# Patient Record
Sex: Female | Born: 1960
Health system: Southern US, Community
[De-identification: ages and names within clinical notes are randomized; demographics above are authoritative.]

## PROBLEM LIST (undated history)

## (undated) DIAGNOSIS — I10 Essential (primary) hypertension: Secondary | ICD-10-CM

## (undated) DIAGNOSIS — E785 Hyperlipidemia, unspecified: Secondary | ICD-10-CM

## (undated) DIAGNOSIS — Z720 Tobacco use: Secondary | ICD-10-CM

## (undated) DIAGNOSIS — K219 Gastro-esophageal reflux disease without esophagitis: Secondary | ICD-10-CM

## (undated) HISTORY — DX: Hyperlipidemia, unspecified: E78.5

## (undated) HISTORY — DX: Tobacco use: Z72.0

## (undated) HISTORY — DX: Gastro-esophageal reflux disease without esophagitis: K21.9

## (undated) HISTORY — DX: Essential (primary) hypertension: I10

## (undated) HISTORY — PX: CATARACT EXTRACTION: SUR2

## (undated) HISTORY — PX: RADIATION SOURCE  PLACEMENT W/ CATHETER / PROBE W/ STEREOTACTIC LOCALIZATION: SUR1037

## (undated) HISTORY — PX: APPENDECTOMY: SHX54

## (undated) HISTORY — DX: Hemochromatosis, unspecified: E83.119

---

## 2015-09-09 DIAGNOSIS — M62838 Other muscle spasm: Secondary | ICD-10-CM | POA: Diagnosis not present

## 2015-09-09 DIAGNOSIS — M5442 Lumbago with sciatica, left side: Secondary | ICD-10-CM | POA: Diagnosis not present

## 2015-10-21 DIAGNOSIS — K219 Gastro-esophageal reflux disease without esophagitis: Secondary | ICD-10-CM | POA: Diagnosis not present

## 2015-10-21 DIAGNOSIS — Z720 Tobacco use: Secondary | ICD-10-CM | POA: Diagnosis not present

## 2015-10-21 DIAGNOSIS — E785 Hyperlipidemia, unspecified: Secondary | ICD-10-CM | POA: Diagnosis not present

## 2015-10-21 DIAGNOSIS — Z0000 Encounter for general adult medical examination without abnormal findings: Secondary | ICD-10-CM | POA: Diagnosis not present

## 2015-10-21 DIAGNOSIS — Z1389 Encounter for screening for other disorder: Secondary | ICD-10-CM | POA: Diagnosis not present

## 2015-11-09 DIAGNOSIS — R928 Other abnormal and inconclusive findings on diagnostic imaging of breast: Secondary | ICD-10-CM | POA: Diagnosis not present

## 2015-11-09 DIAGNOSIS — N6489 Other specified disorders of breast: Secondary | ICD-10-CM | POA: Diagnosis not present

## 2015-11-09 DIAGNOSIS — R922 Inconclusive mammogram: Secondary | ICD-10-CM | POA: Diagnosis not present

## 2016-01-04 DIAGNOSIS — R51 Headache: Secondary | ICD-10-CM | POA: Diagnosis not present

## 2016-01-04 DIAGNOSIS — R05 Cough: Secondary | ICD-10-CM | POA: Diagnosis not present

## 2016-01-04 DIAGNOSIS — J329 Chronic sinusitis, unspecified: Secondary | ICD-10-CM | POA: Diagnosis not present

## 2016-01-23 DIAGNOSIS — Z23 Encounter for immunization: Secondary | ICD-10-CM | POA: Diagnosis not present

## 2016-01-27 DIAGNOSIS — I10 Essential (primary) hypertension: Secondary | ICD-10-CM | POA: Diagnosis not present

## 2016-01-27 DIAGNOSIS — Z720 Tobacco use: Secondary | ICD-10-CM | POA: Diagnosis not present

## 2016-01-27 DIAGNOSIS — E785 Hyperlipidemia, unspecified: Secondary | ICD-10-CM | POA: Diagnosis not present

## 2016-03-28 DIAGNOSIS — J329 Chronic sinusitis, unspecified: Secondary | ICD-10-CM | POA: Diagnosis not present

## 2016-03-28 DIAGNOSIS — I10 Essential (primary) hypertension: Secondary | ICD-10-CM | POA: Diagnosis not present

## 2016-03-28 DIAGNOSIS — E785 Hyperlipidemia, unspecified: Secondary | ICD-10-CM | POA: Diagnosis not present

## 2016-10-05 DIAGNOSIS — M9901 Segmental and somatic dysfunction of cervical region: Secondary | ICD-10-CM | POA: Diagnosis not present

## 2016-10-05 DIAGNOSIS — M9902 Segmental and somatic dysfunction of thoracic region: Secondary | ICD-10-CM | POA: Diagnosis not present

## 2016-10-05 DIAGNOSIS — M542 Cervicalgia: Secondary | ICD-10-CM | POA: Diagnosis not present

## 2016-10-05 DIAGNOSIS — M50322 Other cervical disc degeneration at C5-C6 level: Secondary | ICD-10-CM | POA: Diagnosis not present

## 2016-10-17 DIAGNOSIS — E876 Hypokalemia: Secondary | ICD-10-CM | POA: Diagnosis not present

## 2016-10-17 DIAGNOSIS — I214 Non-ST elevation (NSTEMI) myocardial infarction: Secondary | ICD-10-CM | POA: Diagnosis not present

## 2016-10-17 DIAGNOSIS — I119 Hypertensive heart disease without heart failure: Secondary | ICD-10-CM | POA: Diagnosis not present

## 2016-10-17 DIAGNOSIS — F17211 Nicotine dependence, cigarettes, in remission: Secondary | ICD-10-CM | POA: Diagnosis not present

## 2016-10-17 DIAGNOSIS — R079 Chest pain, unspecified: Secondary | ICD-10-CM | POA: Diagnosis not present

## 2016-10-18 DIAGNOSIS — I119 Hypertensive heart disease without heart failure: Secondary | ICD-10-CM | POA: Diagnosis not present

## 2016-10-18 DIAGNOSIS — F418 Other specified anxiety disorders: Secondary | ICD-10-CM | POA: Diagnosis not present

## 2016-10-18 DIAGNOSIS — R079 Chest pain, unspecified: Secondary | ICD-10-CM | POA: Diagnosis not present

## 2016-10-18 DIAGNOSIS — Z79899 Other long term (current) drug therapy: Secondary | ICD-10-CM | POA: Diagnosis not present

## 2016-10-18 DIAGNOSIS — I249 Acute ischemic heart disease, unspecified: Secondary | ICD-10-CM | POA: Diagnosis not present

## 2016-10-18 DIAGNOSIS — M1990 Unspecified osteoarthritis, unspecified site: Secondary | ICD-10-CM | POA: Diagnosis not present

## 2016-10-18 DIAGNOSIS — I10 Essential (primary) hypertension: Secondary | ICD-10-CM | POA: Diagnosis not present

## 2016-10-18 DIAGNOSIS — E876 Hypokalemia: Secondary | ICD-10-CM | POA: Diagnosis not present

## 2016-10-18 DIAGNOSIS — I252 Old myocardial infarction: Secondary | ICD-10-CM | POA: Diagnosis not present

## 2016-10-18 DIAGNOSIS — Z87891 Personal history of nicotine dependence: Secondary | ICD-10-CM | POA: Diagnosis not present

## 2016-10-18 DIAGNOSIS — F17211 Nicotine dependence, cigarettes, in remission: Secondary | ICD-10-CM | POA: Diagnosis not present

## 2016-10-18 DIAGNOSIS — I214 Non-ST elevation (NSTEMI) myocardial infarction: Secondary | ICD-10-CM | POA: Diagnosis not present

## 2016-10-18 DIAGNOSIS — Z9889 Other specified postprocedural states: Secondary | ICD-10-CM | POA: Diagnosis not present

## 2016-10-18 DIAGNOSIS — E785 Hyperlipidemia, unspecified: Secondary | ICD-10-CM | POA: Diagnosis not present

## 2016-11-07 DIAGNOSIS — I5181 Takotsubo syndrome: Secondary | ICD-10-CM | POA: Diagnosis not present

## 2016-11-07 DIAGNOSIS — F17200 Nicotine dependence, unspecified, uncomplicated: Secondary | ICD-10-CM | POA: Insufficient documentation

## 2016-11-07 DIAGNOSIS — IMO0001 Reserved for inherently not codable concepts without codable children: Secondary | ICD-10-CM | POA: Insufficient documentation

## 2016-11-07 DIAGNOSIS — I10 Essential (primary) hypertension: Secondary | ICD-10-CM | POA: Diagnosis not present

## 2016-11-07 DIAGNOSIS — R29898 Other symptoms and signs involving the musculoskeletal system: Secondary | ICD-10-CM | POA: Insufficient documentation

## 2016-11-07 DIAGNOSIS — Z0389 Encounter for observation for other suspected diseases and conditions ruled out: Secondary | ICD-10-CM

## 2017-01-11 DIAGNOSIS — I10 Essential (primary) hypertension: Secondary | ICD-10-CM | POA: Diagnosis not present

## 2017-01-11 DIAGNOSIS — Z0000 Encounter for general adult medical examination without abnormal findings: Secondary | ICD-10-CM | POA: Diagnosis not present

## 2017-01-11 DIAGNOSIS — I110 Hypertensive heart disease with heart failure: Secondary | ICD-10-CM | POA: Diagnosis not present

## 2017-01-11 DIAGNOSIS — Z1389 Encounter for screening for other disorder: Secondary | ICD-10-CM | POA: Diagnosis not present

## 2017-01-11 DIAGNOSIS — Z6826 Body mass index (BMI) 26.0-26.9, adult: Secondary | ICD-10-CM | POA: Diagnosis not present

## 2017-01-13 DIAGNOSIS — J189 Pneumonia, unspecified organism: Secondary | ICD-10-CM | POA: Diagnosis not present

## 2017-01-26 DIAGNOSIS — R12 Heartburn: Secondary | ICD-10-CM | POA: Diagnosis not present

## 2017-01-26 DIAGNOSIS — Z1211 Encounter for screening for malignant neoplasm of colon: Secondary | ICD-10-CM | POA: Diagnosis not present

## 2017-02-27 DIAGNOSIS — R12 Heartburn: Secondary | ICD-10-CM | POA: Diagnosis not present

## 2017-02-27 DIAGNOSIS — Z1211 Encounter for screening for malignant neoplasm of colon: Secondary | ICD-10-CM | POA: Diagnosis not present

## 2017-06-28 DIAGNOSIS — J0100 Acute maxillary sinusitis, unspecified: Secondary | ICD-10-CM | POA: Diagnosis not present

## 2017-11-07 DIAGNOSIS — R1012 Left upper quadrant pain: Secondary | ICD-10-CM | POA: Diagnosis not present

## 2017-11-07 DIAGNOSIS — Z720 Tobacco use: Secondary | ICD-10-CM | POA: Diagnosis not present

## 2017-11-07 DIAGNOSIS — J329 Chronic sinusitis, unspecified: Secondary | ICD-10-CM | POA: Diagnosis not present

## 2017-11-15 DIAGNOSIS — Z1231 Encounter for screening mammogram for malignant neoplasm of breast: Secondary | ICD-10-CM | POA: Diagnosis not present

## 2018-01-10 DIAGNOSIS — Z23 Encounter for immunization: Secondary | ICD-10-CM | POA: Diagnosis not present

## 2018-01-10 DIAGNOSIS — Z0000 Encounter for general adult medical examination without abnormal findings: Secondary | ICD-10-CM | POA: Diagnosis not present

## 2018-01-10 DIAGNOSIS — Z1331 Encounter for screening for depression: Secondary | ICD-10-CM | POA: Diagnosis not present

## 2018-01-10 DIAGNOSIS — E782 Mixed hyperlipidemia: Secondary | ICD-10-CM | POA: Diagnosis not present

## 2018-01-10 DIAGNOSIS — I10 Essential (primary) hypertension: Secondary | ICD-10-CM | POA: Diagnosis not present

## 2018-01-17 DIAGNOSIS — R1084 Generalized abdominal pain: Secondary | ICD-10-CM | POA: Diagnosis not present

## 2018-01-17 DIAGNOSIS — N3001 Acute cystitis with hematuria: Secondary | ICD-10-CM | POA: Diagnosis not present

## 2018-01-17 DIAGNOSIS — F17210 Nicotine dependence, cigarettes, uncomplicated: Secondary | ICD-10-CM | POA: Diagnosis not present

## 2018-01-17 DIAGNOSIS — R300 Dysuria: Secondary | ICD-10-CM | POA: Diagnosis not present

## 2018-06-19 DIAGNOSIS — R079 Chest pain, unspecified: Secondary | ICD-10-CM | POA: Diagnosis not present

## 2018-06-19 DIAGNOSIS — R350 Frequency of micturition: Secondary | ICD-10-CM | POA: Diagnosis not present

## 2018-06-19 DIAGNOSIS — J329 Chronic sinusitis, unspecified: Secondary | ICD-10-CM | POA: Diagnosis not present

## 2018-06-25 DIAGNOSIS — J988 Other specified respiratory disorders: Secondary | ICD-10-CM | POA: Diagnosis not present

## 2018-06-25 DIAGNOSIS — R51 Headache: Secondary | ICD-10-CM | POA: Diagnosis not present

## 2018-06-25 DIAGNOSIS — R05 Cough: Secondary | ICD-10-CM | POA: Diagnosis not present

## 2018-07-04 ENCOUNTER — Other Ambulatory Visit: Payer: Self-pay

## 2018-07-05 ENCOUNTER — Encounter: Payer: Self-pay | Admitting: Cardiology

## 2018-07-05 ENCOUNTER — Ambulatory Visit (INDEPENDENT_AMBULATORY_CARE_PROVIDER_SITE_OTHER): Payer: BLUE CROSS/BLUE SHIELD | Admitting: Cardiology

## 2018-07-05 ENCOUNTER — Other Ambulatory Visit: Payer: Self-pay

## 2018-07-05 VITALS — BP 130/62 | HR 83 | Wt 173.0 lb

## 2018-07-05 DIAGNOSIS — R079 Chest pain, unspecified: Secondary | ICD-10-CM

## 2018-07-05 DIAGNOSIS — E785 Hyperlipidemia, unspecified: Secondary | ICD-10-CM | POA: Diagnosis not present

## 2018-07-05 DIAGNOSIS — R0789 Other chest pain: Secondary | ICD-10-CM | POA: Diagnosis not present

## 2018-07-05 DIAGNOSIS — I10 Essential (primary) hypertension: Secondary | ICD-10-CM | POA: Diagnosis not present

## 2018-07-05 DIAGNOSIS — I25119 Atherosclerotic heart disease of native coronary artery with unspecified angina pectoris: Secondary | ICD-10-CM | POA: Insufficient documentation

## 2018-07-05 MED ORDER — NITROGLYCERIN 0.4 MG SL SUBL: 0.4000 mg | SUBLINGUAL_TABLET | SUBLINGUAL | 2 refills | Status: DC | PRN

## 2018-07-05 NOTE — Progress Notes (Signed)
Cardiology Office Note:    Date:  07/05/2018   ID:  Lori Freeman, DOB 1960/09/02, MRN [id number]  PCP:  Garwin Brothers, MD  Cardiologist:  Jenean Lindau, MD   Referring MD: Garwin Brothers, MD    ASSESSMENT:    1. Chest discomfort   2. Essential hypertension   3. Dyslipidemia   4. Chest pain, unspecified type    PLAN:    In order of problems listed above:  1. Primary prevention stressed with the patient.  Importance of compliance with diet and medication stressed and she vocalized understanding.  Her blood pressure is stable.  Diet was discussed for dyslipidemia and diabetes mellitus. 2. I spent 5 minutes with the patient discussing solely about smoking. Smoking cessation was counseled. I suggested to the patient also different medications and pharmacological interventions. Patient is keen to try stopping on its own at this time. He will get back to me if he needs any further assistance in this matter. 3. In view of significant symptoms she will undergo exercise stress Cardiolite.  Further recommendations will be made based on the findings of this test. 4. Patient will be seen in follow-up appointment in 3 months or earlier if the patient has any concerns    Medication Adjustments/Labs and Tests Ordered: Current medicines are reviewed at length with the patient today.  Concerns regarding medicines are outlined above.  Orders Placed This Encounter  Procedures  . MYOCARDIAL PERFUSION IMAGING  . EKG 12-Lead  . ECHOCARDIOGRAM COMPLETE   Meds ordered this encounter  Medications  . nitroGLYCERIN (NITROSTAT) 0.4 MG SL tablet    Sig: Place 1 tablet (0.4 mg total) under the tongue every 5 (five) minutes as needed for chest pain.    Dispense:  25 tablet    Refill:  2     History of Present Illness:    Lori Freeman is a 58 y.o. female who is being seen today for the evaluation of chest discomfort on exertion at the request of Garwin Brothers, MD.  Patient is a pleasant 58 year old female.   She has past medical history of essential hypertension, dyslipidemia and active heavy smoker.  She mentions to me that upon exertion she has substernal chest tightness.  No orthopnea or PND.  She is very concerned about the symptoms and wants to be evaluated.  They are affecting her quality of life.  At the time of my evaluation, the patient is alert awake oriented and in no distress.  I reviewed her 2018 coronary angiography and this was unremarkable.  Past Medical History:  Diagnosis Date  . Hemochromatosis   . Hyperlipidemia   . Hypertension      Current Medications: No outpatient medications have been marked as taking for the 07/05/18 encounter (Office Visit) with Revankar, Reita Cliche, MD.     Allergies:   Bupropion   Social History   Socioeconomic History  . Marital status: Widowed    Spouse name: Not on file  . Number of children: Not on file  . Years of education: Not on file  . Highest education level: Not on file  Occupational History  . Not on file  Social Needs  . Financial resource strain: Not on file  . Food insecurity:    Worry: Not on file    Inability: Not on file  . Transportation needs:    Medical: Not on file    Non-medical: Not on file  Tobacco Use  . Smoking status: Former Research scientist (life sciences)  . Smokeless tobacco:  Never Used  Substance and Sexual Activity  . Alcohol use: Not on file  . Drug use: Not on file  . Sexual activity: Not on file  Lifestyle  . Physical activity:    Days per week: Not on file    Minutes per session: Not on file  . Stress: Not on file  Relationships  . Social connections:    Talks on phone: Not on file    Gets together: Not on file    Attends religious service: Not on file    Active member of club or organization: Not on file    Attends meetings of clubs or organizations: Not on file    Relationship status: Not on file  Other Topics Concern  . Not on file  Social History Narrative  . Not on file     Family History: The patient's  family history includes Heart disease in her father; Hypertension in her father.  ROS:   Please see the history of present illness.    All other systems reviewed and are negative.  EKGs/Labs/Other Studies Reviewed:    The following studies were reviewed today: EKG reveals sinus rhythm and nonspecific ST-T changes.   Recent Labs: No results found for requested labs within last 8760 hours.  Recent Lipid Panel No results found for: CHOL, TRIG, HDL, CHOLHDL, VLDL, LDLCALC, LDLDIRECT  Physical Exam:    VS:  BP 130/62 (BP Location: Left Arm, Patient Position: Sitting, Cuff Size: Normal)   Pulse 83   Wt 173 lb (78.5 kg)   SpO2 98%     Wt Readings from Last 3 Encounters:  07/05/18 173 lb (78.5 kg)     GEN: Patient is in no acute distress HEENT: Normal NECK: No JVD; No carotid bruits LYMPHATICS: No lymphadenopathy CARDIAC: S1 S2 regular, 2/6 systolic murmur at the apex. RESPIRATORY:  Clear to auscultation without rales, wheezing or rhonchi  ABDOMEN: Soft, non-tender, non-distended MUSCULOSKELETAL:  No edema; No deformity  SKIN: Warm and dry NEUROLOGIC:  Alert and oriented x 3 PSYCHIATRIC:  Normal affect    Signed, Jenean Lindau, MD  07/05/2018 10:30 AM    [place]

## 2018-07-05 NOTE — Patient Instructions (Signed)
Medication Instructions:  START taking nitroglycerin for chest pain as needed When having chest pain, stop what you are doing and sit down. Take 1 nitro, wait 5 minutes. Still having chest pain, take 1 nitro, wait 5 minutes. Still having chest pain, take 1 nitro, dial 911. Total of 3 nitro in 15 minutes.  If you need a refill on your cardiac medications before your next appointment, please call your pharmacy.   Lab work: NONE If you have labs (blood work) drawn today and your tests are completely normal, you will receive your results only by:  [place] (if you have MyChart) OR  A paper copy in the mail If you have any lab test that is abnormal or we need to change your treatment, we will call you to review the results.  Testing/Procedures: An EKG was performed today.  Your physician has requested that you have an echocardiogram. Echocardiography is a painless test that uses sound waves to create images of your heart. It provides your doctor with information about the size and shape of your heart and how well your hearts chambers and valves are working. This procedure takes approximately one hour. There are no restrictions for this procedure.  Your physician has requested that you have en exercise stress myoview. For further information please visit [email or web address]. Please follow instruction sheet, as given.    Follow-Up: At [facility], you and your health needs are our priority.  As part of our continuing mission to provide you with exceptional heart care, we have created designated Provider Care Teams.  These Care Teams include your primary Cardiologist (physician) and Advanced Practice Providers (APPs -  Physician Assistants and Nurse Practitioners) who all work together to provide you with the care you need, when you need it. You will need a follow up appointment in 3 months.     Any Other Special Instructions Will Be Listed Below  Echocardiogram An echocardiogram  is a procedure that uses painless sound waves (ultrasound) to produce an image of the heart. Images from an echocardiogram can provide important information about:  Signs of coronary artery disease (CAD).  Aneurysm detection. An aneurysm is a weak or damaged part of an artery wall that bulges out from the normal force of blood pumping through the body.  Heart size and shape. Changes in the size or shape of the heart can be associated with certain conditions, including heart failure, aneurysm, and CAD.  Heart muscle function.  Heart valve function.  Signs of a past heart attack.  Fluid buildup around the heart.  Thickening of the heart muscle.  A tumor or infectious growth around the heart valves. Tell a health care provider about:  Any allergies you have.  All medicines you are taking, including vitamins, herbs, eye drops, creams, and over-the-counter medicines.  Any blood disorders you have.  Any surgeries you have had.  Any medical conditions you have.  Whether you are pregnant or may be pregnant. What are the risks? Generally, this is a safe procedure. However, problems may occur, including:  Allergic reaction to dye (contrast) that may be used during the procedure. What happens before the procedure? No specific preparation is needed. You may eat and drink normally. What happens during the procedure?   An IV tube may be inserted into one of your veins.  You may receive contrast through this tube. A contrast is an injection that improves the quality of the pictures from your heart.  A gel will be applied to your  chest.  A wand-like tool (transducer) will be moved over your chest. The gel will help to transmit the sound waves from the transducer.  The sound waves will harmlessly bounce off of your heart to allow the heart images to be captured in real-time motion. The images will be recorded on a computer. The procedure may vary among health care providers and  hospitals. What happens after the procedure?  You may return to your normal, everyday life, including diet, activities, and medicines, unless your health care provider tells you not to do that. Summary  An echocardiogram is a procedure that uses painless sound waves (ultrasound) to produce an image of the heart.  Images from an echocardiogram can provide important information about the size and shape of your heart, heart muscle function, heart valve function, and fluid buildup around your heart.  You do not need to do anything to prepare before this procedure. You may eat and drink normally.  After the echocardiogram is completed, you may return to your normal, everyday life, unless your health care provider tells you not to do that. This information is not intended to replace advice given to you by your health care provider. Make sure you discuss any questions you have with your health care provider. Document Released: 04/08/2000 Document Revised: 05/14/2016 Document Reviewed: 05/14/2016 Elsevier Interactive Patient Education  2019 [place] injection What is this medicine? REGADENOSON is used to test the heart for coronary artery disease. It is used in patients who can not exercise for their stress test. This medicine may be used for other purposes; ask your health care provider or pharmacist if you have questions. COMMON BRAND NAME(S): Lexiscan What should I tell my health care provider before I take this medicine? They need to know if you have any of these conditions: -heart problems -lung or breathing disease, like asthma or COPD -an unusual or allergic reaction to regadenoson, other medicines, foods, dyes, or preservatives -pregnant or trying to get pregnant -breast-feeding How should I use this medicine? This medicine is for injection into a vein. It is given by a health care professional in a hospital or clinic setting. Talk to your pediatrician regarding the  use of this medicine in children. Special care may be needed. Overdosage: If you think you have taken too much of this medicine contact a poison control center or emergency room at once. NOTE: This medicine is only for you. Do not share this medicine with others. What if I miss a dose? This does not apply. What may interact with this medicine? -caffeine -dipyridamole -guarana -theophylline This list may not describe all possible interactions. Give your health care provider a list of all the medicines, herbs, non-prescription drugs, or dietary supplements you use. Also tell them if you smoke, drink alcohol, or use illegal drugs. Some items may interact with your medicine. What should I watch for while using this medicine? Your condition will be monitored carefully while you are receiving this medicine. Do not take medicines, foods, or drinks with caffeine (like coffee, tea, or colas) for at least 12 hours before your test. If you do not know if something contains caffeine, ask your health care professional. What side effects may I notice from receiving this medicine? Side effects that you should report to your doctor or health care professional as soon as possible: -allergic reactions like skin rash, itching or hives, swelling of the face, lips, or tongue -breathing problems -chest pain, tightness or palpitations -severe headache Side effects  that usually do not require medical attention (report to your doctor or health care professional if they continue or are bothersome): -flushing -headache -irritation or pain at site where injected -nausea, vomiting This list may not describe all possible side effects. Call your doctor for medical advice about side effects. You may report side effects to FDA at 1-800-FDA-1088. Where should I keep my medicine? This drug is given in a hospital or clinic and will not be stored at home. NOTE: This sheet is a summary. It may not cover all possible  information. If you have questions about this medicine, talk to your doctor, pharmacist, or health care provider.  2019 Elsevier/Gold Standard (2007-12-10 15:08:13) Exercise Stress Test An exercise stress test is a test to check how your heart works during exercise. You will need to walk on a treadmill or ride an exercise bike for this test. An electrocardiogram (ECG) will record your heartbeat when you are at rest and when you are exercising. You may have an ultrasound or nuclear test after the exercise test. The test is done to check for coronary artery disease (CAD). It is also done to:  See how well you can exercise.  Watch for high blood pressure during exercise.  Test how well you can exercise after treatment.  Check the blood flow to your arms and legs. If your test result is not normal, more testing may be needed. What happens before the procedure?  Follow instructions from your doctor about what you cannot eat or drink. ? Do not have any drinks or foods that have caffeine in them for 24 hours before the test, or as told by your doctor. This includes coffee, tea (even decaf tea), sodas, chocolate, and cocoa.  Ask your doctor about changing or stopping your normal medicines. This is important if you: ? Take diabetes medicines. ? Take beta-blocker medicines. ? Wear a nitroglycerin patch.  If you use an inhaler, bring it with you to the test.  Do not put lotions, powders, creams, or oils on your chest before the test.  Wear comfortable shoes and clothing.  Do not use any products that have nicotine or tobacco in them, such as cigarettes and e-cigarettes. Stop using them at least 4 hours before the test. If you need help quitting, ask your doctor. What happens during the procedure?  Patches (electrodes) will be put on your chest.  Wires will be connected to the patches. The wires will send signals to a machine to record your heartbeat.  Your heart rate will be watched while  you are resting and while you are exercising. Your blood pressure will also be watched during the test.  You will walk on a treadmill or use a stationary bike. If you cannot use these, you may be asked to turn a crank with your hands.  The activity will get harder and will raise your heart rate.  You may be asked to breathe into a tube a few times during the test. This measures the gases that you breathe out.  You will be asked how you are feeling throughout the test.  You will exercise until your heart reaches a target heart rate. You will stop early if: ? You feel dizzy. ? You have chest pain. ? You are out of breath. ? Your blood pressure is too high or too low. ? You have an irregular heartbeat. ? You have pain or aching in your arms or legs. The procedure may vary among doctors and hospitals. What  happens after the procedure?  Your blood pressure, heart rate, breathing rate, and blood oxygen level will be watched after the test.  You may return to your normal diet and activities as told by your doctor.  It is up to you to get the results of your test. Ask your doctor, or the department that is doing the test, when your results will be ready. Summary  An exercise stress test is a test to check how your heart works during exercise.  This test is done to check for coronary artery disease.  Your heart rate will be watched while you are resting and while you are exercising.  Follow instructions from your doctor about what you cannot eat or drink before the test. This information is not intended to replace advice given to you by your health care provider. Make sure you discuss any questions you have with your health care provider. Document Released: 09/28/2007 Document Revised: 07/12/2016 Document Reviewed: 07/12/2016 Elsevier Interactive Patient Education  2019 Elsevier Inc.  Nitroglycerin sublingual tablets What is this medicine? NITROGLYCERIN (nye troe GLI ser in) is a type  of vasodilator. It relaxes blood vessels, increasing the blood and oxygen supply to your heart. This medicine is used to relieve chest pain caused by angina. It is also used to prevent chest pain before activities like climbing stairs, going outdoors in cold weather, or sexual activity. This medicine may be used for other purposes; ask your health care provider or pharmacist if you have questions. COMMON BRAND NAME(S): Nitroquick, Nitrostat, Nitrotab What should I tell my health care provider before I take this medicine? They need to know if you have any of these conditions: -anemia -head injury, recent stroke, or bleeding in the brain -liver disease -previous heart attack -an unusual or allergic reaction to nitroglycerin, other medicines, foods, dyes, or preservatives -pregnant or trying to get pregnant -breast-feeding How should I use this medicine? Take this medicine by mouth as needed. At the first sign of an angina attack (chest pain or tightness) place one tablet under your tongue. You can also take this medicine 5 to 10 minutes before an event likely to produce chest pain. Follow the directions on the prescription label. Let the tablet dissolve under the tongue. Do not swallow whole. Replace the dose if you accidentally swallow it. It will help if your mouth is not dry. Saliva around the tablet will help it to dissolve more quickly. Do not eat or drink, smoke or chew tobacco while a tablet is dissolving. If you are not better within 5 minutes after taking ONE dose of nitroglycerin, call 9-1-1 immediately to seek emergency medical care. Do not take more than 3 nitroglycerin tablets over 15 minutes. If you take this medicine often to relieve symptoms of angina, your doctor or health care professional may provide you with different instructions to manage your symptoms. If symptoms do not go away after following these instructions, it is important to call 9-1-1 immediately. Do not take more than 3  nitroglycerin tablets over 15 minutes. Talk to your pediatrician regarding the use of this medicine in children. Special care may be needed. Overdosage: If you think you have taken too much of this medicine contact a poison control center or emergency room at once. NOTE: This medicine is only for you. Do not share this medicine with others. What if I miss a dose? This does not apply. This medicine is only used as needed. What may interact with this medicine? Do not take this  medicine with any of the following medications: -certain migraine medicines like ergotamine and dihydroergotamine (DHE) -medicines used to treat erectile dysfunction like sildenafil, tadalafil, and vardenafil -riociguat This medicine may also interact with the following medications: -alteplase -aspirin -heparin -medicines for high blood pressure -medicines for mental depression -other medicines used to treat angina -phenothiazines like chlorpromazine, mesoridazine, prochlorperazine, thioridazine This list may not describe all possible interactions. Give your health care provider a list of all the medicines, herbs, non-prescription drugs, or dietary supplements you use. Also tell them if you smoke, drink alcohol, or use illegal drugs. Some items may interact with your medicine. What should I watch for while using this medicine? Tell your doctor or health care professional if you feel your medicine is no longer working. Keep this medicine with you at all times. Sit or lie down when you take your medicine to prevent falling if you feel dizzy or faint after using it. Try to remain calm. This will help you to feel better faster. If you feel dizzy, take several deep breaths and lie down with your feet propped up, or bend forward with your head resting between your knees. You may get drowsy or dizzy. Do not drive, use machinery, or do anything that needs mental alertness until you know how this drug affects you. Do not stand or  sit up quickly, especially if you are an older patient. This reduces the risk of dizzy or fainting spells. Alcohol can make you more drowsy and dizzy. Avoid alcoholic drinks. Do not treat yourself for coughs, colds, or pain while you are taking this medicine without asking your doctor or health care professional for advice. Some ingredients may increase your blood pressure. What side effects may I notice from receiving this medicine? Side effects that you should report to your doctor or health care professional as soon as possible: -blurred vision -dry mouth -skin rash -sweating -the feeling of extreme pressure in the head -unusually weak or tired Side effects that usually do not require medical attention (report to your doctor or health care professional if they continue or are bothersome): -flushing of the face or neck -headache -irregular heartbeat, palpitations -nausea, vomiting This list may not describe all possible side effects. Call your doctor for medical advice about side effects. You may report side effects to FDA at 1-800-FDA-1088. Where should I keep my medicine? Keep out of the reach of children. Store at room temperature between 20 and 25 degrees C (68 and 77 degrees F). Store in [employer]. Protect from light and moisture. Keep tightly closed. Throw away any unused medicine after the expiration date. NOTE: This sheet is a summary. It may not cover all possible information. If you have questions about this medicine, talk to your doctor, pharmacist, or health care provider.  2019 Elsevier/Gold Standard (2013-02-07 17:57:36)

## 2018-07-19 ENCOUNTER — Telehealth: Payer: Self-pay | Admitting: Cardiology

## 2018-07-19 NOTE — Telephone Encounter (Signed)
Cancelled Myoview and ECHO

## 2018-07-19 NOTE — Telephone Encounter (Signed)
Cancelled Myoview and echo

## 2018-07-23 DIAGNOSIS — Z720 Tobacco use: Secondary | ICD-10-CM | POA: Diagnosis not present

## 2018-07-23 DIAGNOSIS — J302 Other seasonal allergic rhinitis: Secondary | ICD-10-CM | POA: Diagnosis not present

## 2018-07-23 DIAGNOSIS — Z87891 Personal history of nicotine dependence: Secondary | ICD-10-CM | POA: Diagnosis not present

## 2018-07-23 DIAGNOSIS — I10 Essential (primary) hypertension: Secondary | ICD-10-CM | POA: Diagnosis not present

## 2018-07-23 DIAGNOSIS — E782 Mixed hyperlipidemia: Secondary | ICD-10-CM | POA: Diagnosis not present

## 2018-07-30 DIAGNOSIS — I10 Essential (primary) hypertension: Secondary | ICD-10-CM | POA: Diagnosis not present

## 2018-08-06 ENCOUNTER — Other Ambulatory Visit: Payer: BLUE CROSS/BLUE SHIELD

## 2018-09-20 ENCOUNTER — Telehealth (HOSPITAL_COMMUNITY): Payer: Self-pay | Admitting: *Deleted

## 2018-09-20 NOTE — Telephone Encounter (Signed)
Patient given detailed instructions per Myocardial Perfusion Study Information Sheet for the test on 09/25/18 at 11:15. Patient notified to arrive 15 minutes early and that it is imperative to arrive on time for appointment to keep from having the test rescheduled.  If you need to cancel or reschedule your appointment, please call the office within 24 hours of your appointment. . Patient verbalized understanding.Lori Freeman

## 2018-09-24 ENCOUNTER — Ambulatory Visit (INDEPENDENT_AMBULATORY_CARE_PROVIDER_SITE_OTHER): Payer: BLUE CROSS/BLUE SHIELD

## 2018-09-24 ENCOUNTER — Other Ambulatory Visit: Payer: Self-pay

## 2018-09-24 DIAGNOSIS — I10 Essential (primary) hypertension: Secondary | ICD-10-CM

## 2018-09-24 DIAGNOSIS — E785 Hyperlipidemia, unspecified: Secondary | ICD-10-CM | POA: Diagnosis not present

## 2018-09-24 NOTE — Progress Notes (Signed)
2 D Echocardiogram performed  09/24/18 Cardell Peach

## 2018-09-25 ENCOUNTER — Ambulatory Visit (INDEPENDENT_AMBULATORY_CARE_PROVIDER_SITE_OTHER): Payer: BC Managed Care – PPO

## 2018-09-25 ENCOUNTER — Telehealth: Payer: Self-pay

## 2018-09-25 DIAGNOSIS — R079 Chest pain, unspecified: Secondary | ICD-10-CM

## 2018-09-25 DIAGNOSIS — I10 Essential (primary) hypertension: Secondary | ICD-10-CM

## 2018-09-25 LAB — MYOCARDIAL PERFUSION IMAGING
LV dias vol: 57 mL (ref 46–106)
LVSYSVOL: 13 mL
Peak HR: 100 {beats}/min
Rest HR: 71 {beats}/min
SDS: 1
SRS: 1
SSS: 2
TID: 1.05

## 2018-09-25 MED ORDER — TECHNETIUM TC 99M TETROFOSMIN IV KIT
31.2000 | PACK | Freq: Once | INTRAVENOUS | Status: AC | PRN
  Administered 2018-09-25: 31.2 via INTRAVENOUS

## 2018-09-25 MED ORDER — REGADENOSON 0.4 MG/5ML IV SOLN
0.4000 mg | Freq: Once | INTRAVENOUS | Status: AC
  Administered 2018-09-25: 0.4 mg via INTRAVENOUS

## 2018-09-25 MED ORDER — TECHNETIUM TC 99M TETROFOSMIN IV KIT
10.9000 | PACK | Freq: Once | INTRAVENOUS | Status: AC | PRN
  Administered 2018-09-25: 10.9 via INTRAVENOUS

## 2018-09-25 NOTE — Telephone Encounter (Signed)
-----   Message from Jenean Lindau, MD sent at 09/25/2018  8:13 AM EDT ----- The results of the study is unremarkable. Please inform patient. I will discuss in detail at next appointment. Cc  primary care/referring physician Jenean Lindau, MD 09/25/2018 8:13 AM

## 2018-09-25 NOTE — Telephone Encounter (Signed)
Patient called and notified of test results. 

## 2018-09-26 ENCOUNTER — Telehealth: Payer: Self-pay

## 2018-09-26 NOTE — Telephone Encounter (Signed)
-----   Message from Jenean Lindau, MD sent at 09/26/2018  8:40 AM EDT ----- The results of the study is unremarkable. Please inform patient. I will discuss in detail at next appointment. Cc  primary care/referring physician Jenean Lindau, MD 09/26/2018 8:40 AM

## 2018-09-26 NOTE — Telephone Encounter (Signed)
Called and left detailed voice message on patients phone regarding test results. 

## 2018-10-03 ENCOUNTER — Telehealth: Payer: Self-pay | Admitting: Cardiology

## 2018-10-03 NOTE — Telephone Encounter (Signed)
Virtual Visit Pre-Appointment Phone Call  "(Name), I am calling you today to discuss your upcoming appointment. We are currently trying to limit exposure to the virus that causes COVID-19 by seeing patients at home rather than in the office."  1. "What is the BEST phone number to call the day of the visit?" - include this in appointment notes  2. Do you have or have access to (through a family member/friend) a smartphone with video capability that we can use for your visit?" a. If yes - list this number in appt notes as cell (if different from BEST phone #) and list the appointment type as a VIDEO visit in appointment notes b. If no - list the appointment type as a PHONE visit in appointment notes  3. Confirm consent - "In the setting of the current Covid19 crisis, you are scheduled for a (phone or video) visit with your provider on (date) at (time).  Just as we do with many in-office visits, in order for you to participate in this visit, we must obtain consent.  If you'd like, I can send this to your mychart (if signed up) or email for you to review.  Otherwise, I can obtain your verbal consent now.  All virtual visits are billed to your insurance company just like a normal visit would be.  By agreeing to a virtual visit, we'd like you to understand that the technology does not allow for your provider to perform an examination, and thus may limit your provider's ability to fully assess your condition. If your provider identifies any concerns that need to be evaluated in person, we will make arrangements to do so.  Finally, though the technology is pretty good, we cannot assure that it will always work on either your or our end, and in the setting of a video visit, we may have to convert it to a phone-only visit.  In either situation, we cannot ensure that we have a secure connection.  Are you willing to proceed?" STAFF: Did the patient verbally acknowledge consent to telehealth visit? Document  YES/NO here: YES  4. Advise patient to be prepared - "Two hours prior to your appointment, go ahead and check your blood pressure, pulse, oxygen saturation, and your weight (if you have the equipment to check those) and write them all down. When your visit starts, your provider will ask you for this information. If you have an Apple Watch or Kardia device, please plan to have heart rate information ready on the day of your appointment. Please have a pen and paper handy nearby the day of the visit as well."  5. Give patient instructions for MyChart download to smartphone OR Doximity/Doxy.me as below if video visit (depending on what platform provider is using)  6. Inform patient they will receive a phone call 15 minutes prior to their appointment time (may be from unknown caller ID) so they should be prepared to answer    TELEPHONE CALL NOTE  Lori Freeman has been deemed a candidate for a follow-up tele-health visit to limit community exposure during the Covid-19 pandemic. I spoke with the patient via phone to ensure availability of phone/video source, confirm preferred email & phone number, and discuss instructions and expectations.  I reminded Lori Freeman to be prepared with any vital sign and/or heart rhythm information that could potentially be obtained via home monitoring, at the time of her visit. I reminded Lori Freeman to expect a phone call prior to her visit.  Frederic Jericho 10/03/2018 1:50 PM   INSTRUCTIONS FOR DOWNLOADING THE MYCHART APP TO SMARTPHONE  - The patient must first make sure to have activated MyChart and know their login information - If Apple, go to [employer] and type in MyChart in the search bar and download the app. If Android, ask patient to go to [employer] and type in [place] in the search bar and download the app. The app is free but as with any other app downloads, their phone may require them to verify saved payment information or Apple/Android password.  -  The patient will need to then log into the app with their MyChart username and password, and select [facility] as their healthcare provider to link the account. When it is time for your visit, go to the MyChart app, find appointments, and click Begin Video Visit. Be sure to Select Allow for your device to access the Microphone and Camera for your visit. You will then be connected, and your provider will be with you shortly.  **If they have any issues connecting, or need assistance please contact MyChart service desk [phone])**  **If using a computer, in order to ensure the best quality for their visit they will need to use either of the following Internet Browsers: [employer], or Google Chrome**  IF USING DOXIMITY or DOXY.ME - The patient will receive a link just prior to their visit by text.     FULL LENGTH CONSENT FOR TELE-HEALTH VISIT   I hereby voluntarily request, consent and authorize [facility] and its employed or contracted physicians, physician assistants, nurse practitioners or other licensed health care professionals (the Practitioner), to provide me with telemedicine health care services (the Services") as deemed necessary by the treating Practitioner. I acknowledge and consent to receive the Services by the Practitioner via telemedicine. I understand that the telemedicine visit will involve communicating with the Practitioner through live audiovisual communication technology and the disclosure of certain medical information by electronic transmission. I acknowledge that I have been given the opportunity to request an in-person assessment or other available alternative prior to the telemedicine visit and am voluntarily participating in the telemedicine visit.  I understand that I have the right to withhold or withdraw my consent to the use of telemedicine in the course of my care at any time, without affecting my right to future care or treatment, and that the  Practitioner or I may terminate the telemedicine visit at any time. I understand that I have the right to inspect all information obtained and/or recorded in the course of the telemedicine visit and may receive copies of available information for a reasonable fee.  I understand that some of the potential risks of receiving the Services via telemedicine include:   Delay or interruption in medical evaluation due to technological equipment failure or disruption;  Information transmitted may not be sufficient (e.g. poor resolution of images) to allow for appropriate medical decision making by the Practitioner; and/or   In rare instances, security protocols could fail, causing a breach of personal health information.  Furthermore, I acknowledge that it is my responsibility to provide information about my medical history, conditions and care that is complete and accurate to the best of my ability. I acknowledge that Practitioner's advice, recommendations, and/or decision may be based on factors not within their control, such as incomplete or inaccurate data provided by me or distortions of diagnostic images or specimens that may result from electronic transmissions. I understand that the  practice of medicine is not an Chief Strategy Officer and that Practitioner makes no warranties or guarantees regarding treatment outcomes. I acknowledge that I will receive a copy of this consent concurrently upon execution via email to the email address I last provided but may also request a printed copy by calling the office of [facility].    I understand that my insurance will be billed for this visit.   I have read or had this consent read to me.  I understand the contents of this consent, which adequately explains the benefits and risks of the Services being provided via telemedicine.   I have been provided ample opportunity to ask questions regarding this consent and the Services and have had my questions answered to my  satisfaction.  I give my informed consent for the services to be provided through the use of telemedicine in my medical care  By participating in this telemedicine visit I agree to the above.

## 2018-10-05 ENCOUNTER — Ambulatory Visit: Payer: BLUE CROSS/BLUE SHIELD | Admitting: Cardiology

## 2018-10-08 ENCOUNTER — Telehealth (INDEPENDENT_AMBULATORY_CARE_PROVIDER_SITE_OTHER): Payer: BC Managed Care – PPO | Admitting: Cardiology

## 2018-10-08 ENCOUNTER — Encounter: Payer: Self-pay | Admitting: Cardiology

## 2018-10-08 ENCOUNTER — Other Ambulatory Visit: Payer: Self-pay

## 2018-10-08 VITALS — Ht 65.0 in | Wt 172.0 lb

## 2018-10-08 DIAGNOSIS — F17200 Nicotine dependence, unspecified, uncomplicated: Secondary | ICD-10-CM

## 2018-10-08 DIAGNOSIS — E785 Hyperlipidemia, unspecified: Secondary | ICD-10-CM

## 2018-10-08 DIAGNOSIS — R079 Chest pain, unspecified: Secondary | ICD-10-CM

## 2018-10-08 DIAGNOSIS — I10 Essential (primary) hypertension: Secondary | ICD-10-CM | POA: Diagnosis not present

## 2018-10-08 DIAGNOSIS — I209 Angina pectoris, unspecified: Secondary | ICD-10-CM

## 2018-10-08 DIAGNOSIS — I5181 Takotsubo syndrome: Secondary | ICD-10-CM

## 2018-10-08 MED ORDER — METOPROLOL TARTRATE 50 MG PO TABS: 100.0000 mg | ORAL_TABLET | Freq: Once | ORAL | 0 refills | Status: DC

## 2018-10-08 NOTE — Addendum Note (Signed)
Addended by: Beckey Rutter on: 10/08/2018 02:39 PM   Modules accepted: Orders

## 2018-10-08 NOTE — Progress Notes (Signed)
Virtual Visit via Telephone Note   This visit type was conducted due to national recommendations for restrictions regarding the COVID-19 Pandemic (e.g. social distancing) in an effort to limit this patient's exposure and mitigate transmission in our community.  Due to her co-morbid illnesses, this patient is at least at moderate risk for complications without adequate follow up.  This format is felt to be most appropriate for this patient at this time.  The patient did not have access to video technology/had technical difficulties with video requiring transitioning to audio format only (telephone).  All issues noted in this document were discussed and addressed.  No physical exam could be performed with this format.  Please refer to the patient's chart for her  consent to telehealth for [facility].   Date:  10/08/2018   ID:  Lori Freeman, DOB [id number], MRN [id number]  Patient Location: Home Provider Location: Home  PCP:  Garwin Brothers, MD  Cardiologist:  No primary care provider on file.   Electrophysiologist:  None   Evaluation Performed:  Follow-Up Visit  Chief Complaint: Angina pectoris  History of Present Illness:    Lori Freeman is a 58 y.o. female with past medical history of essential hypertension, active heavy smoker.  Patient underwent stress testing and echocardiogram.  Results are mentioned below.  They were unremarkable and there was no evidence of ischemia.  However the patient gives concerning symptoms.  She mentions to me that she was walking on the beach with her daughter and developed chest tightness as she was walking briskly.  This went up to the neck.  In this she stopped and felt better.  She did not use nitroglycerin.  At the time of my evaluation, the patient is alert awake oriented and in no distress.  This happens to her when she rushes she says.  No orthopnea or PND.  The patient does not have symptoms concerning for COVID-19 infection (fever, chills, cough, or  new shortness of breath).    Past Medical History:  Diagnosis Date  . Hemochromatosis   . Hyperlipidemia   . Hypertension    Past Surgical History:  Procedure Laterality Date  . APPENDECTOMY    . RADIATION SOURCE  PLACEMENT W/ CATHETER / PROBE W/ STEREOTACTIC LOCALIZATION       Current Meds  Medication Sig  . albuterol (PROVENTIL HFA;VENTOLIN HFA) 108 (90 Base) MCG/ACT inhaler Inhale into the lungs.  Marland Kitchen amLODipine (NORVASC) 5 MG tablet Take 5 mg by mouth daily.   Marland Kitchen atorvastatin (LIPITOR) 40 MG tablet Take 40 mg by mouth daily at 6 PM.   . fexofenadine (ALLEGRA) 60 MG tablet Take 60 mg by mouth daily.   . hydrochlorothiazide (HYDRODIURIL) 12.5 MG tablet Take 12.5 mg by mouth daily.   . meloxicam (MOBIC) 15 MG tablet Take 15 mg by mouth daily.   . methocarbamol (ROBAXIN) 500 MG tablet Take 500 mg by mouth every 6 (six) hours as needed.   Marland Kitchen omeprazole (PRILOSEC) 40 MG capsule Take 40 mg by mouth daily.      Allergies:   Bupropion   Social History   Tobacco Use  . Smoking status: Former Research scientist (life sciences)  . Smokeless tobacco: Never Used  Substance Use Topics  . Alcohol use: Not on file  . Drug use: Not on file     Family Hx: The patient's family history includes Heart disease in her father; Hypertension in her father.  ROS:   Please see the history of present illness.    As  mentioned above All other systems reviewed and are negative.   Prior CV studies:   The following studies were reviewed today:Study Highlights   The left ventricular ejection fraction is hyperdynamic (>65%).  Nuclear stress EF: 78%.  Blood pressure demonstrated a normal response to exercise.  There was no ST segment deviation noted during stress.  This is a low risk study.  No ischemia, no MI, normal EF.     Notes recorded by Tarri Glenn, [place] on 09/26/2018 at 11:21 AM EDT  Called and left detailed voice message on patients phone regarding test results.  ------   Notes recorded by Jenean Lindau, MD on 09/26/2018 at 8:40 AM EDT  The results of the study is unremarkable. Please inform patient. I will discuss in detail at next appointment. [facility] primary care/referring physician  Jenean Lindau, MD 09/26/2018 8:40 AM      Vitals  Height Weight BMI (Calculated)  5\' 5"  (1.651 m) 172 lb (78 kg) 28.62  Study Highlights   The left ventricular ejection fraction is hyperdynamic (>65%).  Nuclear stress EF: 78%.  Blood pressure demonstrated a normal response to exercise.  There was no ST segment deviation noted during stress.  This is a low risk study.  No ischemia, no MI, normal EF.      Labs/Other Tests and Data Reviewed:    EKG:  No ECG reviewed.  Recent Labs: No results found for requested labs within last 8760 hours.   Recent Lipid Panel No results found for: CHOL, TRIG, HDL, CHOLHDL, LDLCALC, LDLDIRECT  Wt Readings from Last 3 Encounters:  10/08/18 172 lb (78 kg)  09/25/18 173 lb (78.5 kg)  07/05/18 173 lb (78.5 kg)     Objective:    Vital Signs:  Ht 5\' 5"  (1.651 m)   Wt 172 lb (78 kg)   BMI 28.62 kg/m    VITAL SIGNS:  reviewed  ASSESSMENT & PLAN:    1. Angina pectoris: I discussed my findings with the patient at extensive length.  Stress test and echocardiogram are unremarkable but her symptoms are very concerning.  Sublingual nitroglycerin prescription was sent, its protocol and 911 protocol explained and the patient vocalized understanding questions were answered to the patient's satisfaction in view of the symptoms I discussed conventional and invasive coronary angiography.  She prefers CT coronary angiography.  This will also help me assess her pulmonary system.  She has been recommended a CT of her lungs in the past but has not had yet obtained it.  A CT coronary angiography will help address both these issues. 2. Essential hypertension: She mentions her blood pressure is stable 3. Mixed dyslipidemia: She says her lipids are checked by her primary  care physician and she was told that they were fine 4. Cigarette smoking: I spent 5 minutes with the patient discussing solely about smoking. Smoking cessation was counseled. I suggested to the patient also different medications and pharmacological interventions. Patient is keen to try stopping on its own at this time. He will get back to me if he needs any further assistance in this matter.  COVID-19 Education: The signs and symptoms of COVID-19 were discussed with the patient and how to seek care for testing (follow up with PCP or arrange E-visit).  The importance of social distancing was discussed today.  Time:   Today, I have spent 14 minutes with the patient with telehealth technology discussing the above problems.     Medication Adjustments/Labs and Tests Ordered: Current medicines  are reviewed at length with the patient today.  Concerns regarding medicines are outlined above.   Tests Ordered: No orders of the defined types were placed in this encounter.   Medication Changes: No orders of the defined types were placed in this encounter.   Follow Up:  Virtual Visit or In Person in 1 month(s)  Signed, Jenean Lindau, MD  10/08/2018 1:44 PM    [place]

## 2018-10-08 NOTE — Patient Instructions (Addendum)
Medication Instructions:  Your physician recommends that you continue on your current medications as directed. Please refer to the Current Medication list given to you today.  If you need a refill on your cardiac medications before your next appointment, please call your pharmacy.   Lab work: You will need to come in 7 days prior to your procedure to have a BMP drawn.  If you have labs (blood work) drawn today and your tests are completely normal, you will receive your results only by:  [place] (if you have MyChart) OR  A paper copy in the mail If you have any lab test that is abnormal or we need to change your treatment, we will call you to review the results.  Testing/Procedures: Non-Cardiac CT scanning, (CAT scanning), is a noninvasive, special x-ray that produces cross-sectional images of the body using x-rays and a computer. CT scans help physicians diagnose and treat medical conditions. For some CT exams, a contrast material is used to enhance visibility in the area of the body being studied. CT scans provide greater clarity and reveal more details than regular x-ray exams.  Please arrive at the [facility] main entrance of [facility] at xx:xx AM (30-45 minutes prior to test start time)  [facility] [address], [place] [address] [phone]  Proceed to the [facility] Radiology Department (First Floor).  Please follow these instructions carefully (unless otherwise directed):   On the Night Before the Test:  Be sure to Drink plenty of water.  Do not consume any caffeinated/decaffeinated beverages or chocolate 12 hours prior to your test.  Do not take any antihistamines 12 hours prior to your test.  On the Day of the Test:  Drink plenty of water. Do not drink any water within one hour of the test.  Do not eat any food 4 hours prior to the test.  You may take your regular medications prior to the test.   Take metoprolol  (Lopressor) IF HR is greater than 55 BPM two hours prior to test.  HOLD Hydrochlorothiazide morning of the test.      After the Test:  Drink plenty of water.  After receiving IV contrast, you may experience a mild flushed feeling. This is normal.  On occasion, you may experience a mild rash up to 24 hours after the test. This is not dangerous. If this occurs, you can take Benadryl 25 mg and increase your fluid intake.  If you experience trouble breathing, this can be serious. If it is severe call 911 IMMEDIATELY. If it is mild, please call our office.   Follow-Up: At [facility], you and your health needs are our priority.  As part of our continuing mission to provide you with exceptional heart care, we have created designated Provider Care Teams.  These Care Teams include your primary Cardiologist (physician) and Advanced Practice Providers (APPs -  Physician Assistants and Nurse Practitioners) who all work together to provide you with the care you need, when you need it. You will need a follow up appointment in 1 months.    Any Other Special Instructions Will Be Listed Below  Metoprolol tablets What is this medicine? METOPROLOL (me TOE proe lole) is a beta-blocker. Beta-blockers reduce the workload on the heart and help it to beat more regularly. This medicine is used to treat high blood pressure and to prevent chest pain. It is also used to after a heart attack and to prevent an additional heart attack  from occurring. This medicine may be used for other purposes; ask your health care provider or pharmacist if you have questions. COMMON BRAND NAME(S): Lopressor What should I tell my health care provider before I take this medicine? They need to know if you have any of these conditions: -diabetes -heart or vessel disease like slow heart rate, worsening heart failure, heart block, sick sinus syndrome or Raynaud's disease -kidney disease -liver disease -lung or breathing disease,  like asthma or emphysema -pheochromocytoma -thyroid disease -an unusual or allergic reaction to metoprolol, other beta-blockers, medicines, foods, dyes, or preservatives -pregnant or trying to get pregnant -breast-feeding How should I use this medicine? Take this medicine by mouth with a drink of water. Follow the directions on the prescription label. Take this medicine immediately after meals. Take your doses at regular intervals. Do not take more medicine than directed. Do not stop taking this medicine suddenly. This could lead to serious heart-related effects. Talk to your pediatrician regarding the use of this medicine in children. Special care may be needed. Overdosage: If you think you have taken too much of this medicine contact a poison control center or emergency room at once. NOTE: This medicine is only for you. Do not share this medicine with others. What if I miss a dose? If you miss a dose, take it as soon as you can. If it is almost time for your next dose, take only that dose. Do not take double or extra doses. What may interact with this medicine? This medicine may interact with the following medications: -certain medicines for blood pressure, heart disease, irregular heart beat -certain medicines for depression like monoamine oxidase (MAO) inhibitors, fluoxetine, or paroxetine -clonidine -dobutamine -epinephrine -isoproterenol -reserpine This list may not describe all possible interactions. Give your health care provider a list of all the medicines, herbs, non-prescription drugs, or dietary supplements you use. Also tell them if you smoke, drink alcohol, or use illegal drugs. Some items may interact with your medicine. What should I watch for while using this medicine? Visit your doctor or health care professional for regular check ups. Contact your doctor right away if your symptoms worsen. Check your blood pressure and pulse rate regularly. Ask your health care professional  what your blood pressure and pulse rate should be, and when you should contact them. You may get drowsy or dizzy. Do not drive, use machinery, or do anything that needs mental alertness until you know how this medicine affects you. Do not sit or stand up quickly, especially if you are an older patient. This reduces the risk of dizzy or fainting spells. Contact your doctor if these symptoms continue. Alcohol may interfere with the effect of this medicine. Avoid alcoholic drinks. What side effects may I notice from receiving this medicine? Side effects that you should report to your doctor or health care professional as soon as possible: -allergic reactions like skin rash, itching or hives -cold or numb hands or feet -depression -difficulty breathing -faint -fever with sore throat -irregular heartbeat, chest pain -rapid weight gain -swollen legs or ankles Side effects that usually do not require medical attention (report to your doctor or health care professional if they continue or are bothersome): -anxiety or nervousness -change in sex drive or performance -dry skin -headache -nightmares or trouble sleeping -short term memory loss -stomach upset or diarrhea -unusually tired This list may not describe all possible side effects. Call your doctor for medical advice about side effects. You may report side effects to FDA  at 1-800-FDA-1088. Where should I keep my medicine? Keep out of the reach of children. Store at room temperature between 15 and 30 degrees C (59 and 86 degrees F). Throw away any unused medicine after the expiration date. NOTE: This sheet is a summary. It may not cover all possible information. If you have questions about this medicine, talk to your doctor, pharmacist, or health care provider.  2019 Elsevier/Gold Standard (2012-12-14 14:40:36)    Coronary Angiogram A coronary angiogram is an X-ray procedure that is used to examine the arteries in the heart. In this  procedure, a dye (contrast dye) is injected through a long, thin tube (catheter). The catheter is inserted through the groin, wrist, or arm. The dye is injected into each artery, then X-rays are taken to show if there is a blockage in the arteries of the heart. This procedure can also show if you have valve disease or a disease of the aorta, and it can be used to check the overall function of your heart muscle. You may have a coronary angiogram if:  You are having chest pain, or other symptoms of angina, and you are at risk for heart disease.  You have an abnormal electrocardiogram (ECG) or stress test.  You have chest pain and heart failure.  You are having irregular heart rhythms.  You and your health care provider determine that the benefits of the test information outweigh the risks of the procedure. Let your health care provider know about:  Any allergies you have, including allergies to contrast dye.  All medicines you are taking, including vitamins, herbs, eye drops, creams, and over-the-counter medicines.  Any problems you or family members have had with anesthetic medicines.  Any blood disorders you have.  Any surgeries you have had.  History of kidney problems or kidney failure.  Any medical conditions you have.  Whether you are pregnant or may be pregnant. What are the risks? Generally, this is a safe procedure. However, problems may occur, including:  Infection.  Allergic reaction to medicines or dyes that are used.  Bleeding from the access site or other locations.  Kidney injury, especially in people with impaired kidney function.  Stroke (rare).  Heart attack (rare).  Damage to other structures or organs. What happens before the procedure? Staying hydrated Follow instructions from your health care provider about hydration, which may include:  Up to 2 hours before the procedure - you may continue to drink clear liquids, such as water, clear fruit juice,  black coffee, and plain tea. Eating and drinking restrictions Follow instructions from your health care provider about eating and drinking, which may include:  8 hours before the procedure - stop eating heavy meals or foods such as meat, fried foods, or fatty foods.  6 hours before the procedure - stop eating light meals or foods, such as toast or cereal.  2 hours before the procedure - stop drinking clear liquids. General instructions  Ask your health care provider about: ? Changing or stopping your regular medicines. This is especially important if you are taking diabetes medicines or blood thinners. ? Taking medicines such as ibuprofen. These medicines can thin your blood. Do not take these medicines before your procedure if your health care provider instructs you not to, though aspirin may be recommended prior to coronary angiograms.  Plan to have someone take you home from the hospital or clinic.  You may need to have blood tests or X-rays done. What happens during the procedure?  An  IV tube will be inserted into one of your veins.  You will be given one or more of the following: ? A medicine to help you relax (sedative). ? A medicine to numb the area where the catheter will be inserted into an artery (local anesthetic).  To reduce your risk of infection: ? Your health care team will wash or sanitize their hands. ? Your skin will be washed with soap. ? Hair may be removed from the area where the catheter will be inserted.  You will be connected to a continuous ECG monitor.  The catheter will be inserted into an artery. The location may be in your groin, in your wrist, or in the fold of your arm (near your elbow).  A type of X-ray (fluoroscopy) will be used to help guide the catheter to the opening of the blood vessel that is being examined.  A dye will be injected into the catheter, and X-rays will be taken. The dye will help to show where any narrowing or blockages are  located in the heart arteries.  Tell your health care provider if you have any chest pain or trouble breathing during the procedure.  If blockages are found, your health care provider may perform another procedure, such as inserting a coronary stent. The procedure may vary among health care providers and hospitals. What happens after the procedure?  After the procedure, you will need to keep the area still for a few hours, or for as long as told by your health care provider. If the procedure is done through the groin, you will be instructed to not bend and not cross your legs.  The insertion site will be checked frequently.  The pulse in your foot or wrist will be checked frequently.  You may have additional blood tests, X-rays, and a test that records the electrical activity of your heart (ECG).  Do not drive for 24 hours if you were given a sedative. Summary  A coronary angiogram is an X-ray procedure that is used to look into the arteries in the heart.  During the procedure, a dye (contrast dye) is injected through a long, thin tube (catheter). The catheter is inserted through the groin, wrist, or arm.  Tell your health care provider about any allergies you have, including allergies to contrast dye.  After the procedure, you will need to keep the area still for a few hours, or for as long as told by your health care provider. This information is not intended to replace advice given to you by your health care provider. Make sure you discuss any questions you have with your health care provider. Document Released: 10/16/2002 Document Revised: 01/22/2016 Document Reviewed: 01/22/2016 Elsevier Interactive Patient Education  2019 [employer].

## 2018-11-01 DIAGNOSIS — I209 Angina pectoris, unspecified: Secondary | ICD-10-CM | POA: Diagnosis not present

## 2018-11-01 DIAGNOSIS — E785 Hyperlipidemia, unspecified: Secondary | ICD-10-CM | POA: Diagnosis not present

## 2018-11-01 DIAGNOSIS — I10 Essential (primary) hypertension: Secondary | ICD-10-CM | POA: Diagnosis not present

## 2018-11-02 LAB — BASIC METABOLIC PANEL
BUN/Creatinine Ratio: 19 (ref 9–23)
BUN: 18 mg/dL (ref 6–24)
CO2: 26 mmol/L (ref 20–29)
Calcium: 9.2 mg/dL (ref 8.7–10.2)
Chloride: 101 mmol/L (ref 96–106)
Creatinine, Ser: 0.96 mg/dL (ref 0.57–1.00)
GFR calc Af Amer: 75 mL/min/{1.73_m2} (ref >59)
GFR calc non Af Amer: 65 mL/min/{1.73_m2} (ref >59)
Glucose: 94 mg/dL (ref 65–99)
Potassium: 4.2 mmol/L (ref 3.5–5.2)
Sodium: 141 mmol/L (ref 134–144)

## 2018-11-05 ENCOUNTER — Encounter (HOSPITAL_COMMUNITY): Payer: Self-pay

## 2018-11-05 ENCOUNTER — Ambulatory Visit (HOSPITAL_COMMUNITY)
Admission: RE | Admit: 2018-11-05 | Discharge: 2018-11-05 | Disposition: A | Discharge status: Home / Self Care | Payer: BC Managed Care – PPO | Source: Ambulatory Visit | Attending: Cardiology | Admitting: Cardiology

## 2018-11-05 ENCOUNTER — Other Ambulatory Visit: Payer: Self-pay

## 2018-11-05 ENCOUNTER — Telehealth: Payer: Self-pay | Admitting: Cardiology

## 2018-11-05 DIAGNOSIS — R079 Chest pain, unspecified: Secondary | ICD-10-CM

## 2018-11-05 MED ORDER — NITROGLYCERIN 0.4 MG SL SUBL
SUBLINGUAL_TABLET | SUBLINGUAL | Status: AC
  Filled 2018-11-05: qty 2

## 2018-11-05 MED ORDER — METOPROLOL TARTRATE 5 MG/5ML IV SOLN
5.0000 mg | INTRAVENOUS | Status: DC | PRN
  Filled 2018-11-05: qty 5

## 2018-11-05 MED ORDER — METOPROLOL TARTRATE 5 MG/5ML IV SOLN
INTRAVENOUS | Status: AC
  Filled 2018-11-05: qty 10

## 2018-11-05 MED ORDER — NITROGLYCERIN 0.4 MG SL SUBL
0.8000 mg | SUBLINGUAL_TABLET | Freq: Once | SUBLINGUAL | Status: AC
  Administered 2018-11-05: 12:00:00 0.8 mg via SUBLINGUAL
  Filled 2018-11-05: qty 25

## 2018-11-05 MED ORDER — IOHEXOL 350 MG/ML SOLN
80.0000 mL | Freq: Once | INTRAVENOUS | Status: AC | PRN
  Administered 2018-11-05: 80 mL via INTRAVENOUS

## 2018-11-05 NOTE — Telephone Encounter (Signed)
Patient called and states that she did not know if insurance was authorized as of Friday.. please call patient

## 2018-11-06 DIAGNOSIS — E785 Hyperlipidemia, unspecified: Secondary | ICD-10-CM | POA: Diagnosis not present

## 2018-11-06 DIAGNOSIS — I10 Essential (primary) hypertension: Secondary | ICD-10-CM | POA: Diagnosis not present

## 2018-11-06 DIAGNOSIS — Z720 Tobacco use: Secondary | ICD-10-CM | POA: Diagnosis not present

## 2018-11-06 DIAGNOSIS — K219 Gastro-esophageal reflux disease without esophagitis: Secondary | ICD-10-CM | POA: Diagnosis not present

## 2018-11-07 ENCOUNTER — Encounter: Payer: Self-pay | Admitting: *Deleted

## 2018-11-08 ENCOUNTER — Telehealth: Payer: Self-pay

## 2018-11-08 NOTE — Telephone Encounter (Signed)
-----   Message from Jenean Lindau, MD sent at 11/06/2018 11:54 AM EDT ----- The results of the study is unremarkable. Please inform patient. I will discuss in detail at next appointment. [facility]  primary care/referring physician Jenean Lindau, MD 11/06/2018 11:53 AM

## 2018-11-08 NOTE — Telephone Encounter (Signed)
Called and left detailed voice message on patients phone regarding test results. 

## 2018-11-09 ENCOUNTER — Other Ambulatory Visit: Payer: Self-pay

## 2018-11-09 ENCOUNTER — Telehealth: Payer: BC Managed Care – PPO | Admitting: Cardiology

## 2019-01-09 DIAGNOSIS — Z23 Encounter for immunization: Secondary | ICD-10-CM | POA: Diagnosis not present

## 2019-02-07 DIAGNOSIS — I252 Old myocardial infarction: Secondary | ICD-10-CM | POA: Diagnosis not present

## 2019-02-07 DIAGNOSIS — J302 Other seasonal allergic rhinitis: Secondary | ICD-10-CM | POA: Diagnosis not present

## 2019-02-07 DIAGNOSIS — R9431 Abnormal electrocardiogram [ECG] [EKG]: Secondary | ICD-10-CM | POA: Diagnosis not present

## 2019-02-07 DIAGNOSIS — R200 Anesthesia of skin: Secondary | ICD-10-CM | POA: Diagnosis not present

## 2019-02-07 DIAGNOSIS — R202 Paresthesia of skin: Secondary | ICD-10-CM | POA: Diagnosis not present

## 2019-02-07 DIAGNOSIS — M1990 Unspecified osteoarthritis, unspecified site: Secondary | ICD-10-CM | POA: Diagnosis not present

## 2019-02-07 DIAGNOSIS — D1802 Hemangioma of intracranial structures: Secondary | ICD-10-CM | POA: Diagnosis not present

## 2019-02-07 DIAGNOSIS — I10 Essential (primary) hypertension: Secondary | ICD-10-CM | POA: Diagnosis not present

## 2019-02-07 DIAGNOSIS — E7800 Pure hypercholesterolemia, unspecified: Secondary | ICD-10-CM | POA: Diagnosis not present

## 2019-02-07 DIAGNOSIS — F418 Other specified anxiety disorders: Secondary | ICD-10-CM | POA: Diagnosis not present

## 2019-02-07 DIAGNOSIS — K219 Gastro-esophageal reflux disease without esophagitis: Secondary | ICD-10-CM | POA: Diagnosis not present

## 2019-02-07 DIAGNOSIS — R29700 NIHSS score 0: Secondary | ICD-10-CM | POA: Diagnosis not present

## 2019-02-07 DIAGNOSIS — F17210 Nicotine dependence, cigarettes, uncomplicated: Secondary | ICD-10-CM | POA: Diagnosis not present

## 2019-02-07 DIAGNOSIS — Z79899 Other long term (current) drug therapy: Secondary | ICD-10-CM | POA: Diagnosis not present

## 2019-02-07 DIAGNOSIS — R29818 Other symptoms and signs involving the nervous system: Secondary | ICD-10-CM | POA: Diagnosis not present

## 2019-02-07 DIAGNOSIS — I6350 Cerebral infarction due to unspecified occlusion or stenosis of unspecified cerebral artery: Secondary | ICD-10-CM | POA: Diagnosis not present

## 2019-02-07 DIAGNOSIS — I2510 Atherosclerotic heart disease of native coronary artery without angina pectoris: Secondary | ICD-10-CM | POA: Diagnosis not present

## 2019-02-07 DIAGNOSIS — E559 Vitamin D deficiency, unspecified: Secondary | ICD-10-CM | POA: Diagnosis not present

## 2019-02-08 DIAGNOSIS — R200 Anesthesia of skin: Secondary | ICD-10-CM | POA: Diagnosis not present

## 2019-02-08 DIAGNOSIS — I6350 Cerebral infarction due to unspecified occlusion or stenosis of unspecified cerebral artery: Secondary | ICD-10-CM | POA: Diagnosis not present

## 2019-02-08 DIAGNOSIS — R202 Paresthesia of skin: Secondary | ICD-10-CM | POA: Diagnosis not present

## 2019-02-08 DIAGNOSIS — D1802 Hemangioma of intracranial structures: Secondary | ICD-10-CM | POA: Diagnosis not present

## 2019-02-09 DIAGNOSIS — R200 Anesthesia of skin: Secondary | ICD-10-CM | POA: Diagnosis not present

## 2019-02-09 DIAGNOSIS — D1802 Hemangioma of intracranial structures: Secondary | ICD-10-CM | POA: Diagnosis not present

## 2019-02-09 DIAGNOSIS — R202 Paresthesia of skin: Secondary | ICD-10-CM | POA: Diagnosis not present

## 2019-02-09 DIAGNOSIS — I6350 Cerebral infarction due to unspecified occlusion or stenosis of unspecified cerebral artery: Secondary | ICD-10-CM | POA: Diagnosis not present

## 2019-02-11 DIAGNOSIS — Z6829 Body mass index (BMI) 29.0-29.9, adult: Secondary | ICD-10-CM | POA: Diagnosis not present

## 2019-02-11 DIAGNOSIS — Z0000 Encounter for general adult medical examination without abnormal findings: Secondary | ICD-10-CM | POA: Diagnosis not present

## 2019-02-11 DIAGNOSIS — I10 Essential (primary) hypertension: Secondary | ICD-10-CM | POA: Diagnosis not present

## 2019-02-11 DIAGNOSIS — E782 Mixed hyperlipidemia: Secondary | ICD-10-CM | POA: Diagnosis not present

## 2019-02-11 DIAGNOSIS — Z1331 Encounter for screening for depression: Secondary | ICD-10-CM | POA: Diagnosis not present

## 2019-02-18 DIAGNOSIS — R928 Other abnormal and inconclusive findings on diagnostic imaging of breast: Secondary | ICD-10-CM | POA: Diagnosis not present

## 2019-02-18 DIAGNOSIS — Z1231 Encounter for screening mammogram for malignant neoplasm of breast: Secondary | ICD-10-CM | POA: Diagnosis not present

## 2019-02-20 ENCOUNTER — Ambulatory Visit (INDEPENDENT_AMBULATORY_CARE_PROVIDER_SITE_OTHER): Payer: BC Managed Care – PPO | Admitting: Neurology

## 2019-02-20 ENCOUNTER — Other Ambulatory Visit: Payer: Self-pay

## 2019-02-20 ENCOUNTER — Encounter: Payer: Self-pay | Admitting: Neurology

## 2019-02-20 VITALS — BP 155/70 | HR 85 | Temp 97.9°F | Ht 65.0 in | Wt 176.5 lb

## 2019-02-20 DIAGNOSIS — R200 Anesthesia of skin: Secondary | ICD-10-CM | POA: Diagnosis not present

## 2019-02-20 DIAGNOSIS — D1800 Hemangioma unspecified site: Secondary | ICD-10-CM | POA: Insufficient documentation

## 2019-02-20 DIAGNOSIS — I5181 Takotsubo syndrome: Secondary | ICD-10-CM

## 2019-02-20 NOTE — Progress Notes (Signed)
[facility]  PATIENT: Lori Freeman DOB: [birth year]  REFERRING DOCTOR OR PCP:  Dr. Reesa Chew SOURCE: Patient, notes from primary care, notes from emergency room, imaging and laboratory reports, CT and MRI images personally reviewed.  _________________________________   HISTORICAL  CHIEF COMPLAINT:  Chief Complaint  Patient presents with  . New Patient (Initial Visit)    RM 12. Paper referral from Garwin Brothers, MD (PCP) for facial numbness. She was at [facility] two weekends ago. Did CT and MRI . She also had left arm numbness. Numbness in face lasted for 7 hours. They r/o stroke. They recommended EEG.     HISTORY OF PRESENT ILLNESS:  I had the pleasure seeing your patient, Lori Freeman, at [facility] for neurologic consultation regarding her episode of numbness.  She is a 58 year old right-handed woman who presented to [facility] 02/07/2019 with numbness.   At lunch time, while at work, she had the onset of left facial numbness.  She felt lightheaded for a minute or so.  The lightheadedness improved but the numbness persisted and actually worsened.  She was able to speak and understand and there was no slurring.  Co-workers were concerned that she might have had a stroke so she presented to [facility] ED.    She had a CT scan showing no acute findings.    Symptoms persisted until 8 pm,or a total of about 7 hours.   At one point while in [facility], she also noted numbness of the arm but it only lasted 1-2 minutes and resolved.   She never had numbness on the right.   She felt generally weak and more tired.  She was kept in the hospital.  She never lost consciousness and actually drove to the hospital from work.  While driving, she felt mentally foggy and drove slow.   She had had headaches earlier that week but not near the time of the event and she had only a minimal headache later that evening.    She had an MRI of the brain which showed a  4 mm inferior right frontal lobe cavernous angioma.   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    Her bloodwork showed she might have [place] fever and she was placed on doxycycline.  She had no fever but had headaches around that time.   Interestingly, her sister was diagnosed with RMSF after a tick bite and was in a coma (> 50 years ago).    She has never had a similar event in the past or TIA/stroke symptoms.    She has no h/o seizures.     She is scheduled for an EEG next week.  I personally reviewed the MRIs of the brain and MR angiogram from October 15-17.  The MRI of the brain shows a 4 mm cavernous angioma in the inferior right frontal lobe that could involve both gray and white matter.  The MR angiogram shows a very dominant right vertebral artery (likely congenital) but no significant stenosis.  She had a NSTEMI, in 2018 but her cardiac catheterization did not show stenosis.  She was under stress at the time and diagnosed with Tokotsubo syndrome.  She has hereditary hemochromatosis, and her father has this as well.   She has had some episodes of pressure in her chest and right arm (not left) with exertion and continues to follow up with cardiology.  REVIEW OF SYSTEMS: Constitutional: No fevers, chills, sweats,  or change in appetite Eyes: No visual changes, double vision, eye pain Ear, nose and throat: No hearing loss, ear pain, nasal congestion, sore throat Cardiovascular: No chest pain, palpitations Respiratory: No shortness of breath at rest or with exertion.   No wheezes GastrointestinaI: No nausea, vomiting, diarrhea, abdominal pain, fecal incontinence Genitourinary: No dysuria, urinary retention or frequency.  No nocturia. Musculoskeletal: No neck pain, back pain Integumentary: No rash, pruritus, skin lesions Neurological: as above Psychiatric: No depression at this time.  No anxiety Endocrine: No palpitations,  diaphoresis, change in appetite, change in weigh or increased thirst Hematologic/Lymphatic: No anemia, purpura, petechiae. Allergic/Immunologic: No itchy/runny eyes, nasal congestion, recent allergic reactions, rashes  ALLERGIES: Allergies  Allergen Reactions  . Bupropion Hives    HOME MEDICATIONS:  Current Outpatient Medications:  .  albuterol (PROVENTIL HFA;VENTOLIN HFA) 108 (90 Base) MCG/ACT inhaler, Inhale into the lungs as needed. , Disp: , Rfl:  .  amLODipine (NORVASC) 5 MG tablet, Take 5 mg by mouth daily. , Disp: , Rfl:  .  atorvastatin (LIPITOR) 40 MG tablet, Take 40 mg by mouth daily at 6 PM. , Disp: , Rfl:  .  CHANTIX 1 MG tablet, Take 1 mg by mouth 2 (two) times daily., Disp: , Rfl:  .  fexofenadine (ALLEGRA) 60 MG tablet, Take 60 mg by mouth as needed. , Disp: , Rfl:  .  hydrochlorothiazide (HYDRODIURIL) 12.5 MG tablet, Take 12.5 mg by mouth daily. , Disp: , Rfl:  .  meloxicam (MOBIC) 15 MG tablet, Take 15 mg by mouth daily. , Disp: , Rfl:  .  methocarbamol (ROBAXIN) 500 MG tablet, Take 500 mg by mouth every 6 (six) hours as needed. , Disp: , Rfl:  .  omeprazole (PRILOSEC) 40 MG capsule, Take 40 mg by mouth daily. , Disp: , Rfl:  .  metoprolol tartrate (LOPRESSOR) 50 MG tablet, Take 2 tablets (100 mg total) by mouth once for 1 dose., Disp: 2 tablet, Rfl: 0 .  nitroGLYCERIN (NITROSTAT) 0.4 MG SL tablet, Place 1 tablet (0.4 mg total) under the tongue every 5 (five) minutes as needed for chest pain., Disp: 25 tablet, Rfl: 2 .  potassium chloride (K-DUR,KLOR-CON) 10 MEQ tablet, Take 10 mEq by mouth daily. , Disp: , Rfl:   PAST MEDICAL HISTORY: Past Medical History:  Diagnosis Date  . Hemochromatosis   . Hyperlipidemia   . Hypertension     PAST SURGICAL HISTORY: Past Surgical History:  Procedure Laterality Date  . APPENDECTOMY    . RADIATION SOURCE  PLACEMENT W/ CATHETER / PROBE W/ STEREOTACTIC LOCALIZATION      FAMILY HISTORY: Family History  Problem Relation Age  of Onset  . Hypertension Father   . Heart disease Father     SOCIAL HISTORY:  Social History   Socioeconomic History  . Marital status: Widowed    Spouse name: Not on file  . Number of children: Not on file  . Years of education: Not on file  . Highest education level: Not on file  Occupational History  . Not on file  Social Needs  . Financial resource strain: Not on file  . Food insecurity    Worry: Not on file    Inability: Not on file  . Transportation needs    Medical: Not on file    Non-medical: Not on file  Tobacco Use  . Smoking status: Former Research scientist (life sciences)  . Smokeless tobacco: Never Used  Substance and Sexual Activity  . Alcohol use: Not on file  .  Drug use: Not on file  . Sexual activity: Not on file  Lifestyle  . Physical activity    Days per week: Not on file    Minutes per session: Not on file  . Stress: Not on file  Relationships  . Social Herbalist on phone: Not on file    Gets together: Not on file    Attends religious service: Not on file    Active member of club or organization: Not on file    Attends meetings of clubs or organizations: Not on file    Relationship status: Not on file  . Intimate partner violence    Fear of current or ex partner: Not on file    Emotionally abused: Not on file    Physically abused: Not on file    Forced sexual activity: Not on file  Other Topics Concern  . Not on file  Social History Narrative   Lives   Caffeine use:      PHYSICAL EXAM  Vitals:   02/20/19 1411  BP: (!) 155/70  Pulse: 85  Temp: 97.9 F (36.6 C)  SpO2: 97%  Weight: 176 lb 8 oz (80.1 kg)  Height: 5\' 5"  (1.651 m)    Body mass index is 29.37 kg/m.   General: The patient is well-developed and well-nourished and in no acute distress  HEENT:  Head is [place]/AT.  Sclera are anicteric.  Funduscopic exam shows normal optic discs and retinal vessels.  Neck: No carotid bruits are noted.  The neck is nontender.  Cardiovascular: The  heart has a regular rate and rhythm with a normal S1 and S2. There were no murmurs, gallops or rubs.    Skin: Extremities are without rash or  edema.  Musculoskeletal:  Back is nontender  Neurologic Exam  Mental status: The patient is alert and oriented x 3 at the time of the examination. The patient has apparent normal recent and remote memory, with an apparently normal attention span and concentration ability.   Speech is normal.  Cranial nerves: Extraocular movements are full. Pupils are equal, round, and reactive to light and accomodation.  Visual fields are full.  Facial symmetry is present. There is good facial sensation to soft touch bilaterally.Facial strength is normal.  Trapezius and sternocleidomastoid strength is normal. No dysarthria is noted.  The tongue is midline, and the patient has symmetric elevation of the soft palate. No obvious hearing deficits are noted.  Motor:  Muscle bulk is normal.   Tone is normal. Strength is  5 / 5 in all 4 extremities.   Sensory: Sensory testing is intact to pinprick, soft touch and vibration sensation in all 4 extremities.  Coordination: Cerebellar testing reveals good finger-nose-finger and heel-to-shin bilaterally.  Gait and station: Station is normal.   Gait is normal. Tandem gait is normal. Romberg is negative.   Reflexes: Deep tendon reflexes are symmetric and normal bilaterally.   Plantar responses are flexor.    DIAGNOSTIC DATA (LABS, IMAGING, TESTING) - I reviewed patient records, labs, notes, testing and imaging myself where available.  No results found for: WBC, HGB, HCT, MCV, PLT    Component Value Date/Time   NA 141 11/01/2018 1526   K 4.2 11/01/2018 1526   CL 101 11/01/2018 1526   CO2 26 11/01/2018 1526   GLUCOSE 94 11/01/2018 1526   BUN 18 11/01/2018 1526   CREATININE 0.96 11/01/2018 1526   CALCIUM 9.2 11/01/2018 1526   GFRNONAA 65 11/01/2018 1526  GFRAA 75 11/01/2018 1526       ASSESSMENT AND PLAN   Numbness  Hereditary hemochromatosis (HCC)  Takotsubo syndrome  Cavernous angioma   In summary, Ms. Daquino is a 58 year old woman with left-sided numbness who had brain imaging studies showing a cavernous angioma in the inferior right frontal lobe.  Whether not there is a relationship is uncertain.  The angioma is small and not in a location that would cause numbness.  However, cavernous angiomas can be associated with a higher risk of seizures and it is possible that she had a partial seizure that caused the symptoms.  She also might have had a complicated migraine headache.  She reports having blood work that showed that she may have had [facility] spotted fever and she was treated with doxycycline after the lab work returned.  I do not have the actual results to see if she had IgG or IgM but her symptoms would not be typical for [facility] spotted fever.  We will check an EEG.  If it shows any epileptiform activity such as spikes or sharp waves, we would consider starting antiepileptic medication.  Otherwise, it is more likely that we will not have a clear indication of what happened that day.  We will let her know the results of the EEG shortly after it is done and she should give [place] a call if she has any new or worsening neurologic symptoms.  Thank you for asking me to see Ms. Haus.  Please let me know if I can be of further assistance with her or other patients in the future.   Mayo Owczarzak A. Felecia Shelling, MD, [facility] [id number], 0000000 PM Certified in Neurology, Clinical Neurophysiology, Sleep Medicine and Neuroimaging  [facility] [address] [place], [place] [address] [phone]

## 2019-03-13 ENCOUNTER — Ambulatory Visit (INDEPENDENT_AMBULATORY_CARE_PROVIDER_SITE_OTHER): Payer: BC Managed Care – PPO | Admitting: Neurology

## 2019-03-13 ENCOUNTER — Other Ambulatory Visit: Payer: Self-pay

## 2019-03-13 DIAGNOSIS — R928 Other abnormal and inconclusive findings on diagnostic imaging of breast: Secondary | ICD-10-CM | POA: Diagnosis not present

## 2019-03-13 DIAGNOSIS — N6001 Solitary cyst of right breast: Secondary | ICD-10-CM | POA: Diagnosis not present

## 2019-03-13 DIAGNOSIS — R200 Anesthesia of skin: Secondary | ICD-10-CM

## 2019-03-13 DIAGNOSIS — D1800 Hemangioma unspecified site: Secondary | ICD-10-CM

## 2019-03-13 DIAGNOSIS — N6489 Other specified disorders of breast: Secondary | ICD-10-CM | POA: Diagnosis not present

## 2019-03-13 DIAGNOSIS — R2990 Unspecified symptoms and signs involving the nervous system: Secondary | ICD-10-CM

## 2019-03-13 NOTE — Progress Notes (Signed)
   [facility]  EEG (ELECTROENCEPHALOGRAM) REPORT   STUDY DATE: 03/13/2019 PATIENT NAME: Lori Freeman DOB: 03/15/1961 MRN: [id number]  ORDERING CLINICIAN: Richard A. Felecia Shelling, MD. PhD  TECHNOLOGIST: Nonda Lou RPSGT TECHNIQUE: Electroencephalogram was recorded utilizing standard 10-20 system of lead placement and reformatted into average and bipolar montages.  RECORDING TIME: 22 minutes 50 seconds  CLINICAL INFORMATION: 58 year old woman with an episode of left-sided numbness who has a cavernous angioma in the right frontal lobe  FINDINGS: A digital EEG was performed while the patient was awake and drowsy. While awake and most alert there was a 10 hz posterior dominant rhythm. Voltages and frequencies were symmetric.  There were no focal, lateralizing, epileptiform activity or seizures seen.  Photic stimulation had a normal driving response.  Towards the end of hyperventilation intermittent rhythmic delta activity was noted predominantly in the frontal lobes.  This quickly recovered with normal breathing.. EKG channel shows normal sinus rhythm.  The patient did not become significantly drowsy or fall asleep.Marland Kitchen  IMPRESSION: This is a normal EEG for the patient was awake   INTERPRETING PHYSICIAN:   Richard A. Felecia Shelling, MD, PhD, [facility] Certified in Neurology, Clinical Neurophysiology, Sleep Medicine, Pain Medicine and Neuroimaging  [facility] [address], [place], [place] [address] [phone]

## 2019-03-14 ENCOUNTER — Telehealth: Payer: Self-pay

## 2019-03-14 NOTE — Telephone Encounter (Signed)
-----   Message from Britt Bottom, MD sent at 03/13/2019  6:40 PM EST ----- Please let her know that the EEG was normal for age.

## 2019-03-14 NOTE — Telephone Encounter (Signed)
Pt notified of results and verbalized understanding  

## 2019-05-22 DIAGNOSIS — I10 Essential (primary) hypertension: Secondary | ICD-10-CM | POA: Diagnosis not present

## 2019-05-22 DIAGNOSIS — R229 Localized swelling, mass and lump, unspecified: Secondary | ICD-10-CM | POA: Diagnosis not present

## 2019-05-22 DIAGNOSIS — E782 Mixed hyperlipidemia: Secondary | ICD-10-CM | POA: Diagnosis not present

## 2019-05-31 DIAGNOSIS — Z20828 Contact with and (suspected) exposure to other viral communicable diseases: Secondary | ICD-10-CM | POA: Diagnosis not present

## 2019-05-31 DIAGNOSIS — M791 Myalgia, unspecified site: Secondary | ICD-10-CM | POA: Diagnosis not present

## 2019-05-31 DIAGNOSIS — R070 Pain in throat: Secondary | ICD-10-CM | POA: Diagnosis not present

## 2019-08-21 DIAGNOSIS — E782 Mixed hyperlipidemia: Secondary | ICD-10-CM | POA: Diagnosis not present

## 2019-08-21 DIAGNOSIS — I10 Essential (primary) hypertension: Secondary | ICD-10-CM | POA: Diagnosis not present

## 2019-08-21 DIAGNOSIS — G4700 Insomnia, unspecified: Secondary | ICD-10-CM | POA: Diagnosis not present

## 2019-09-19 DIAGNOSIS — S30860A Insect bite (nonvenomous) of lower back and pelvis, initial encounter: Secondary | ICD-10-CM | POA: Diagnosis not present

## 2019-09-19 DIAGNOSIS — L03312 Cellulitis of back [any part except buttock]: Secondary | ICD-10-CM | POA: Diagnosis not present

## 2019-09-19 DIAGNOSIS — W57XXXA Bitten or stung by nonvenomous insect and other nonvenomous arthropods, initial encounter: Secondary | ICD-10-CM | POA: Diagnosis not present

## 2019-09-19 DIAGNOSIS — R110 Nausea: Secondary | ICD-10-CM | POA: Diagnosis not present

## 2019-11-27 DIAGNOSIS — Z87891 Personal history of nicotine dependence: Secondary | ICD-10-CM | POA: Diagnosis not present

## 2019-11-27 DIAGNOSIS — G4700 Insomnia, unspecified: Secondary | ICD-10-CM | POA: Diagnosis not present

## 2019-11-27 DIAGNOSIS — E782 Mixed hyperlipidemia: Secondary | ICD-10-CM | POA: Diagnosis not present

## 2019-11-27 DIAGNOSIS — Z6828 Body mass index (BMI) 28.0-28.9, adult: Secondary | ICD-10-CM | POA: Diagnosis not present

## 2019-11-27 DIAGNOSIS — I10 Essential (primary) hypertension: Secondary | ICD-10-CM | POA: Diagnosis not present

## 2019-12-03 ENCOUNTER — Other Ambulatory Visit: Payer: Self-pay | Admitting: Cardiology

## 2019-12-03 DIAGNOSIS — R079 Chest pain, unspecified: Secondary | ICD-10-CM

## 2020-01-10 DIAGNOSIS — Z23 Encounter for immunization: Secondary | ICD-10-CM | POA: Diagnosis not present

## 2020-02-16 IMAGING — CT CT HEAR MORPH WITH CTA COR WITH SCORE WITH CA WITH CONTRAST AND
4 of 7 series · 8 of 20 positions shown, 9 images · IV contrast (APPLIED)
Comparison: None.
COMPARISON: None.

Addendum:
EXAM:
OVER-READ INTERPRETATION  CT CHEST

The following report is an over-read performed by radiologist Dr.
over-read does not include interpretation of cardiac or coronary
anatomy or pathology. The coronary calcium score/coronary CTA
interpretation by the cardiologist is attached.
CLINICAL DATA: 58-year-old female with hypertension, hyperlipidemia
and chest pain.
Cardiac/Coronary  CT
TECHNIQUE: The patient was scanned on a Phillips Force scanner.

[Series 6: best diast 73 % · axial · 0.35mm/px · z∈[-174,-130]mm · 2 of 331 slices shown, 3 images]
[im 111/331  vessel]
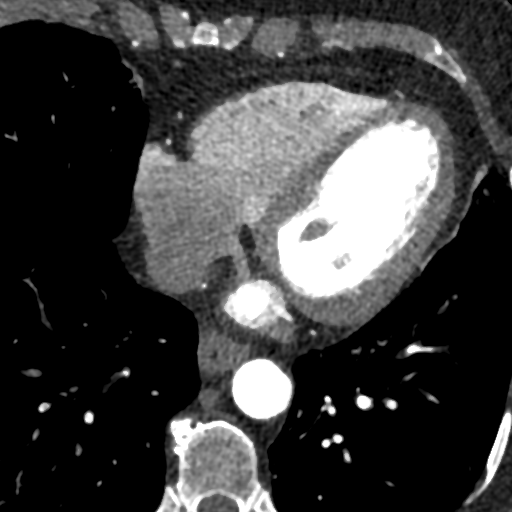
[im 111/331  lung]
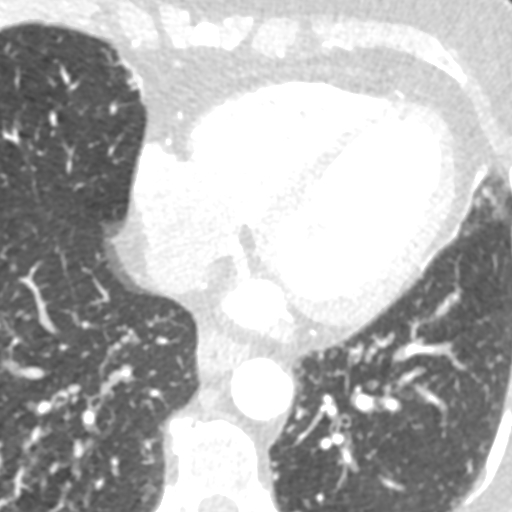
[im 221/331  vessel]
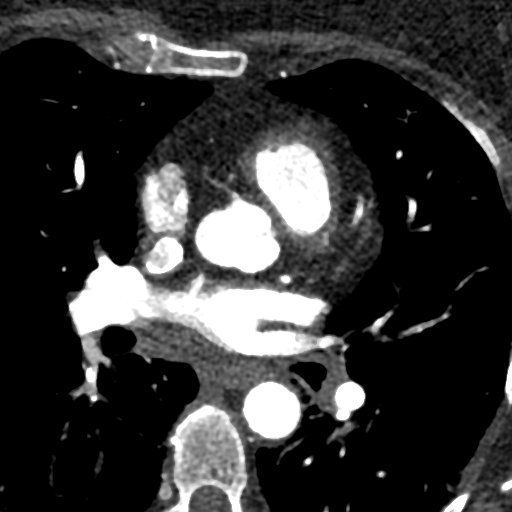

[Series 7: best syst 42 % · axial · 0.35mm/px · z∈[-174,-130]mm · 2 of 331 slices shown]
[im 111/331  vessel]
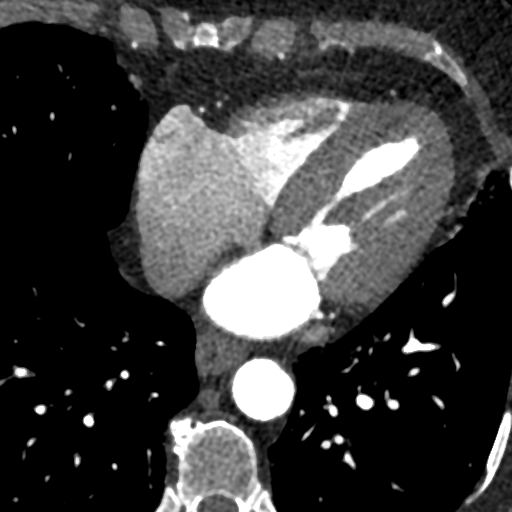
[im 221/331  vessel]
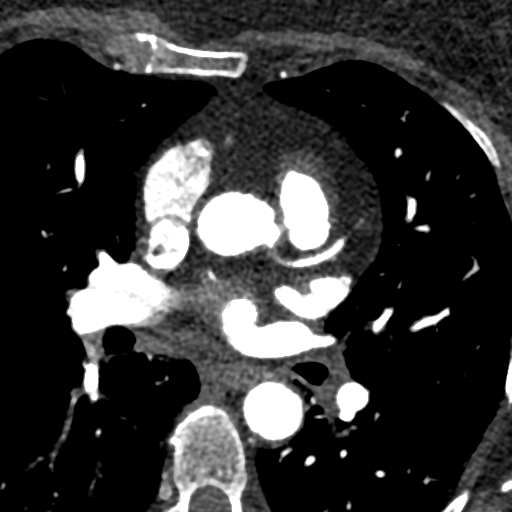

[Series 8: ts diast sharp 73 % · axial · 0.35mm/px · z∈[-174,-130]mm · 2 of 331 slices shown]
[im 111/331  lung]
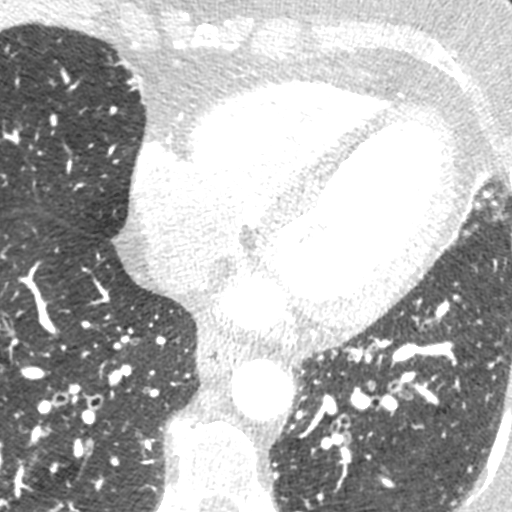
[im 221/331  lung]
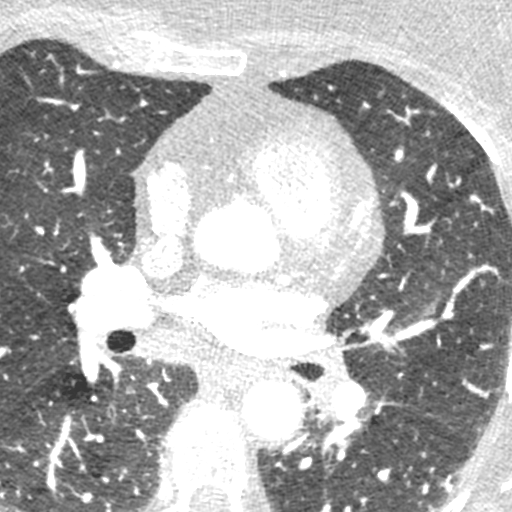

[Series 9: ts syst sharp 42 % · axial · 0.35mm/px · z∈[-174,-130]mm · 2 of 331 slices shown]
[im 111/331  lung]
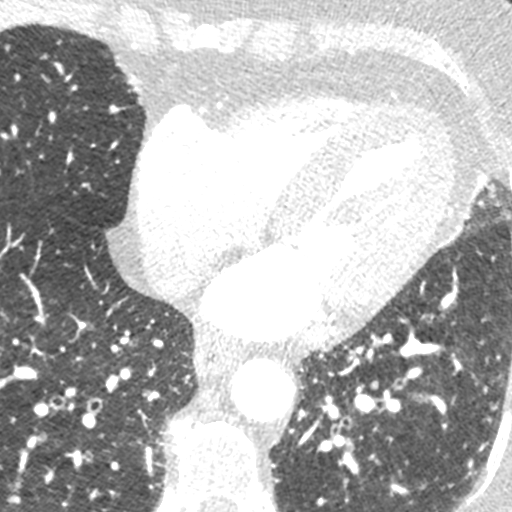
[im 221/331  lung]
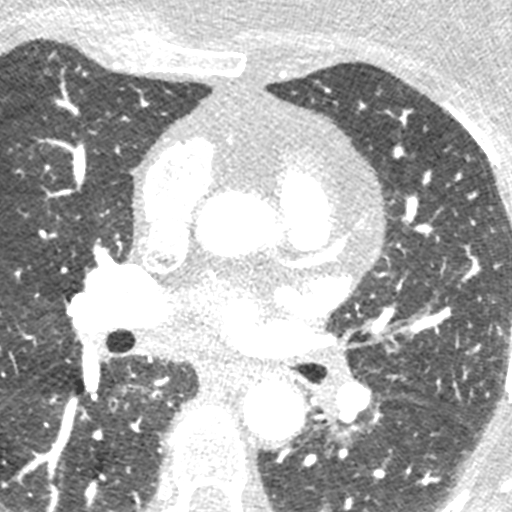

[8 of 20 positions shown; findings below may reference images not displayed]

FINDINGS: Vascular: No significant vascular findings. Normal heart size. No
pericardial effusion.

Mediastinum/Nodes: No enlarged mediastinal or axillary lymph nodes.
Thyroid gland, trachea, and esophagus demonstrate no significant
findings.

Lungs/Pleura: No pleural effusion.

Upper Abdomen: No acute abnormality. 5 mm low-density hyperdense
structure in the posterior dome of liver. Too small to characterize.

Musculoskeletal: No chest wall mass or suspicious bone lesions
identified.
IMPRESSION: 1. Essentially negative over-read. No suspicious findings
identified.
FINDINGS: A 120 kV prospective scan was triggered in the descending thoracic
aorta at 111 HU's. Axial non-contrast 3 mm slices were carried out
through the heart. The data set was analyzed on a dedicated work
station and scored using the Agatson method. Gantry rotation speed
was 250 msecs and collimation was .6 mm. No beta blockade and 0.8 mg
of sl NTG was given. The 3D data set was reconstructed in 5%
intervals of the 67-82 % of the R-R cycle. Diastolic phases were
analyzed on a dedicated work station using MPR, MIP and VRT modes.
The patient received 80 cc of contrast.

Aorta: Normal size. Mild diffuse atherosclerotic disease and
calcifications. No dissection.

Aortic Valve:  Trileaflet.  Mild calcifications.

Coronary Arteries:  Normal coronary origin.  Right dominance.

RCA is a large dominant artery that gives rise to PDA and PLA. There
is no plaque.

Left main is a large artery that gives rise to LAD and LCX arteries.
Left main has no plaque.

LAD is a large vessel that gives rise to one diagonal artery and has
no plaque.

LCX is a non-dominant artery that gives rise to one large OM1
branch. There is no plaque.

Other findings:

Normal pulmonary vein drainage into the left atrium.

Normal left atrial appendage without a thrombus.

Normal size of the pulmonary artery.
IMPRESSION: 1. Coronary calcium score of 0. This was 0 percentile for age and
sex matched control.

2. Normal coronary origin with right dominance.

3. No evidence of CAD.  Consider non-cardiac causes of chest pain.

*** End of Addendum ***
EXAM:
OVER-READ INTERPRETATION  CT CHEST

The following report is an over-read performed by radiologist Dr.
over-read does not include interpretation of cardiac or coronary
anatomy or pathology. The coronary calcium score/coronary CTA
interpretation by the cardiologist is attached.
FINDINGS: Vascular: No significant vascular findings. Normal heart size. No
pericardial effusion.

Mediastinum/Nodes: No enlarged mediastinal or axillary lymph nodes.
Thyroid gland, trachea, and esophagus demonstrate no significant
findings.

Lungs/Pleura: No pleural effusion.

Upper Abdomen: No acute abnormality. 5 mm low-density hyperdense
structure in the posterior dome of liver. Too small to characterize.

Musculoskeletal: No chest wall mass or suspicious bone lesions
identified.
IMPRESSION: 1. Essentially negative over-read. No suspicious findings
identified.

## 2020-02-28 DIAGNOSIS — G4700 Insomnia, unspecified: Secondary | ICD-10-CM | POA: Diagnosis not present

## 2020-02-28 DIAGNOSIS — I10 Essential (primary) hypertension: Secondary | ICD-10-CM | POA: Diagnosis not present

## 2020-02-28 DIAGNOSIS — E782 Mixed hyperlipidemia: Secondary | ICD-10-CM | POA: Diagnosis not present

## 2020-02-28 DIAGNOSIS — R7303 Prediabetes: Secondary | ICD-10-CM | POA: Diagnosis not present

## 2020-03-21 DIAGNOSIS — Z20822 Contact with and (suspected) exposure to covid-19: Secondary | ICD-10-CM | POA: Diagnosis not present

## 2020-03-21 DIAGNOSIS — J00 Acute nasopharyngitis [common cold]: Secondary | ICD-10-CM | POA: Diagnosis not present

## 2020-03-21 DIAGNOSIS — R062 Wheezing: Secondary | ICD-10-CM | POA: Diagnosis not present

## 2020-03-27 ENCOUNTER — Inpatient Hospital Stay: Payer: BC Managed Care – PPO

## 2020-03-27 DIAGNOSIS — J209 Acute bronchitis, unspecified: Secondary | ICD-10-CM | POA: Diagnosis not present

## 2020-03-27 NOTE — Progress Notes (Signed)
PT STABLE AT TIME OF DISCHARGE 

## 2020-03-30 ENCOUNTER — Other Ambulatory Visit: Payer: Self-pay | Admitting: Pharmacist

## 2020-03-31 ENCOUNTER — Other Ambulatory Visit: Payer: Self-pay | Admitting: Oncology

## 2020-03-31 NOTE — Progress Notes (Signed)
Lori Freeman  [address] [place],  [place]  [address] [phone]  Clinic Day:  04/01/2020  Referring physician: Garwin Brothers, MD   HISTORY OF PRESENT ILLNESS:  The patient is a 59 y.o. female with hemochromatosis (C282Y/C282Y).  She occasionally requires maintenance phlebotomies to keep this disease from causing a precipitous rise in her iron parameters.  She comes in today to reassess her iron parameters.  Since her last visit, the patient has been doing well.  With respect to her hemochromatosis, she denies having any systemic symptoms which concern her for disease-related complications.  PHYSICAL EXAM:  Blood pressure (!) 165/75, pulse 84, temperature 99.3 F (37.4 C), resp. rate 16, height 5\' 5"  (1.651 m), weight 167 lb (75.8 kg), SpO2 94 %. Wt Readings from Last 3 Encounters:  04/02/20 167 lb 12 oz (76.1 kg)  04/01/20 167 lb (75.8 kg)  11/28/19 171 lb 1 oz (77.6 kg)   Body mass index is 27.79 kg/m. Performance status (ECOG): 0 - Asymptomatic Physical Exam Constitutional:      Appearance: Normal appearance. She is not ill-appearing.  HENT:     Mouth/Throat:     Mouth: Mucous membranes are moist.     Pharynx: Oropharynx is clear. No oropharyngeal exudate or posterior oropharyngeal erythema.  Cardiovascular:     Rate and Rhythm: Normal rate and regular rhythm.     Heart sounds: No murmur heard.  No friction rub. No gallop.   Pulmonary:     Effort: Pulmonary effort is normal. No respiratory distress.     Breath sounds: Normal breath sounds. No wheezing, rhonchi or rales.  Abdominal:     General: Bowel sounds are normal. There is no distension.     Palpations: Abdomen is soft. There is no mass.     Tenderness: There is no abdominal tenderness.  Musculoskeletal:        General: No swelling.     Right lower leg: No edema.     Left lower leg: No edema.  Lymphadenopathy:     Cervical: No cervical adenopathy.     Upper Body:     Right  upper body: No supraclavicular or axillary adenopathy.     Left upper body: No supraclavicular or axillary adenopathy.     Lower Body: No right inguinal adenopathy. No left inguinal adenopathy.  Skin:    General: Skin is warm.     Coloration: Skin is not jaundiced.     Findings: No lesion or rash.  Neurological:     General: No focal deficit present.     Mental Status: She is alert and oriented to person, place, and time. Mental status is at baseline.     Cranial Nerves: Cranial nerves are intact.  Psychiatric:        Mood and Affect: Mood normal.        Behavior: Behavior normal.        Thought Content: Thought content normal.     LABS:   CBC Latest Ref Rng & Units 04/01/2020  WBC - 8.5  Hemoglobin 12.0 - 16.0 14.6  Hematocrit 36 - 46 43  Platelets 150 - 399 289   CMP Latest Ref Rng & Units 04/01/2020 11/01/2018  Glucose 65 - 99 mg/dL - 94  BUN 4 - 21 14 18   Creatinine 0.5 - 1.1 0.8 0.96  Sodium 137 - 147 137 141  Potassium 3.4 - 5.3 3.6 4.2  Chloride 99 - 108 102 101  CO2 13 -  22 25(A) 26  Calcium 8.7 - 10.7 9.4 9.2  Alkaline Phos 25 - 125 106 -  AST 13 - 35 24 -  ALT 7 - 35 35 -     ASSESSMENT & PLAN:   Assessment/Plan:  A 59 y.o. female with hemochromatosis (C282Y/C282Y).  When evaluating her iron parameters, her ferritin is above 50.  Based upon this, I will arrange for her to be phlebotomized today and in 2 months to prevent her iron stores from precipitously rising from her underlying hemochromatosis.  I will see her back in 4 months to reassess her iron parameters.   .The patient understands all the plans discussed today and is in agreement with them.      Tayvian Holycross Macarthur Critchley, MD

## 2020-03-31 NOTE — Progress Notes (Incomplete)
PT STABLE AT TIME OF DISCHARGE 

## 2020-04-01 ENCOUNTER — Telehealth: Payer: Self-pay | Admitting: Oncology

## 2020-04-01 ENCOUNTER — Telehealth: Payer: Self-pay

## 2020-04-01 ENCOUNTER — Other Ambulatory Visit: Payer: Self-pay

## 2020-04-01 ENCOUNTER — Other Ambulatory Visit: Payer: Self-pay | Admitting: Oncology

## 2020-04-01 ENCOUNTER — Inpatient Hospital Stay: Payer: BC Managed Care – PPO | Attending: Oncology | Admitting: Oncology

## 2020-04-01 ENCOUNTER — Inpatient Hospital Stay: Payer: BC Managed Care – PPO

## 2020-04-01 ENCOUNTER — Encounter: Payer: Self-pay | Admitting: Oncology

## 2020-04-01 ENCOUNTER — Other Ambulatory Visit: Payer: Self-pay | Admitting: Hematology and Oncology

## 2020-04-01 DIAGNOSIS — Z0001 Encounter for general adult medical examination with abnormal findings: Secondary | ICD-10-CM | POA: Diagnosis not present

## 2020-04-01 LAB — BASIC METABOLIC PANEL
BUN: 14 (ref 4–21)
CO2: 25 — AB (ref 13–22)
Chloride: 102 (ref 99–108)
Creatinine: 0.8 (ref 0.5–1.1)
Glucose: 148
Potassium: 3.6 (ref 3.4–5.3)
Sodium: 137 (ref 137–147)

## 2020-04-01 LAB — COMPREHENSIVE METABOLIC PANEL
Albumin: 4.4 (ref 3.5–5.0)
Calcium: 9.4 (ref 8.7–10.7)

## 2020-04-01 LAB — HEPATIC FUNCTION PANEL
ALT: 35 (ref 7–35)
AST: 24 (ref 13–35)
Alkaline Phosphatase: 106 (ref 25–125)
Bilirubin, Total: 0.5

## 2020-04-01 LAB — CBC AND DIFFERENTIAL
HCT: 43 (ref 36–46)
Hemoglobin: 14.6 (ref 12.0–16.0)
Neutrophils Absolute: 5.02
Platelets: 289 (ref 150–399)
WBC: 8.5

## 2020-04-01 LAB — CBC: RBC: 5.18 — AB (ref 3.87–5.11)

## 2020-04-01 NOTE — Telephone Encounter (Signed)
Per 12/8 LOS, patient scheduled for April 2022 Appt's.  Gave patient Appt Summary

## 2020-04-01 NOTE — Telephone Encounter (Addendum)
Pt returned my call, to let me know she got my message & will be at [facility] @ 11am as scheduled.   I attempted call to pt, to notify her that lab results are back. She will need phlebotomy today as scheduled. Ferritin 89.6; Iron 110.0 (goal per Dr Bobby Rumpf is ferritin to be below 50, and iron to be below 100). No answer.   Pt LVM this morning on triage line @ [phone], asking for iron results. They have still not resulted. I have left message for our phlebotomist to please contact [facility] & check on results. [phone]    Pt LVM on triage nurse line asking for results @ 1616. I had Kelli, PA, to look & see if labs had resulted, they have not. I called pt back @1644 , & told her to call us back in the morning.   Pt called to ask for lab results, as well as, if she needs to come for phlebotomy tomorrow.  I told pt, that the iron panel results are not back yet. [phone]

## 2020-04-02 ENCOUNTER — Inpatient Hospital Stay: Payer: BC Managed Care – PPO

## 2020-04-02 NOTE — Progress Notes (Signed)
Lori Freeman presents today for phlebotomy per MD orders. Phlebotomy procedure started at 12:02 and ended at 12:12. 512 grams removed. Patient observed for 30 minutes after procedure without any incident. Patient tolerated procedure well. IV needle removed intact.

## 2020-04-02 NOTE — Progress Notes (Signed)
Lori Freeman Lori Freeman presents today for phlebotomy per MD orders. Phlebotomy procedure started at 1202 and ended at 1212. 512grams removed. Patient observed for 30 minutes after procedure without any incident. Patient tolerated procedure well. IV needle removed intact.

## 2020-04-02 NOTE — Patient Instructions (Signed)

## 2020-04-05 ENCOUNTER — Other Ambulatory Visit: Payer: Self-pay | Admitting: Oncology

## 2020-04-14 ENCOUNTER — Encounter: Payer: Self-pay | Admitting: Oncology

## 2020-06-01 DIAGNOSIS — L821 Other seborrheic keratosis: Secondary | ICD-10-CM | POA: Diagnosis not present

## 2020-06-01 DIAGNOSIS — I10 Essential (primary) hypertension: Secondary | ICD-10-CM | POA: Diagnosis not present

## 2020-06-01 DIAGNOSIS — E782 Mixed hyperlipidemia: Secondary | ICD-10-CM | POA: Diagnosis not present

## 2020-06-01 DIAGNOSIS — R7303 Prediabetes: Secondary | ICD-10-CM | POA: Diagnosis not present

## 2020-07-30 ENCOUNTER — Inpatient Hospital Stay: Payer: BC Managed Care – PPO | Attending: Oncology

## 2020-07-30 ENCOUNTER — Other Ambulatory Visit: Payer: Self-pay

## 2020-07-30 ENCOUNTER — Other Ambulatory Visit: Payer: Self-pay | Admitting: Hematology and Oncology

## 2020-07-30 LAB — CBC AND DIFFERENTIAL
HCT: 44 (ref 36–46)
Hemoglobin: 14.7 (ref 12.0–16.0)
Neutrophils Absolute: 4.86
Platelets: 302 (ref 150–399)
WBC: 9

## 2020-07-30 LAB — IRON AND TIBC
Iron: 89 ug/dL (ref 28–170)
Saturation Ratios: 24 % (ref 10.4–31.8)
TIBC: 370 ug/dL (ref 250–450)
UIBC: 281 ug/dL

## 2020-07-30 LAB — CBC
MCV: 82 (ref 81–99)
RBC: 5.32 — AB (ref 3.87–5.11)

## 2020-07-30 LAB — FERRITIN: Ferritin: 23 ng/mL (ref 11–307)

## 2020-07-31 ENCOUNTER — Inpatient Hospital Stay: Payer: BC Managed Care – PPO | Admitting: Oncology

## 2020-08-04 NOTE — Progress Notes (Signed)
[facility]  [address] [place],  [place]  [address] [phone]  Clinic Day:  08/06/2019  Referring physician: Garwin Brothers, MD   HISTORY OF PRESENT ILLNESS:  The patient is a 60 y.o. female with hemochromatosis (C282Y/C282Y).  She occasionally requires maintenance phlebotomies to keep this disease from causing a precipitous rise in her iron parameters.  She comes in today to reassess her iron parameters.  Since her last visit, the patient has been doing well.  With respect to her hemochromatosis, she denies having any systemic symptoms which concern her for disease-related complications.  PHYSICAL EXAM:  Blood pressure (!) 167/74, pulse 82, temperature 98.3 F (36.8 C), resp. rate 16, height 5\' 5"  (1.651 m), weight 168 lb 8 oz (76.4 kg), SpO2 96 %. Wt Readings from Last 3 Encounters:  08/05/20 168 lb 8 oz (76.4 kg)  04/02/20 167 lb 12 oz (76.1 kg)  04/01/20 167 lb (75.8 kg)   Body mass index is 28.04 kg/m. Performance status (ECOG): 0 - Asymptomatic Physical Exam Constitutional:      Appearance: Normal appearance. She is not ill-appearing.  HENT:     Mouth/Throat:     Mouth: Mucous membranes are moist.     Pharynx: Oropharynx is clear. No oropharyngeal exudate or posterior oropharyngeal erythema.  Cardiovascular:     Rate and Rhythm: Normal rate and regular rhythm.     Heart sounds: No murmur heard. No friction rub. No gallop.   Pulmonary:     Effort: Pulmonary effort is normal. No respiratory distress.     Breath sounds: Normal breath sounds. No wheezing, rhonchi or rales.  Chest:  Breasts:     Right: No axillary adenopathy or supraclavicular adenopathy.     Left: No axillary adenopathy or supraclavicular adenopathy.    Abdominal:     General: Bowel sounds are normal. There is no distension.     Palpations: Abdomen is soft. There is no mass.     Tenderness: There is no abdominal tenderness.  Musculoskeletal:        General: No  swelling.     Right lower leg: No edema.     Left lower leg: No edema.  Lymphadenopathy:     Cervical: No cervical adenopathy.     Upper Body:     Right upper body: No supraclavicular or axillary adenopathy.     Left upper body: No supraclavicular or axillary adenopathy.     Lower Body: No right inguinal adenopathy. No left inguinal adenopathy.  Skin:    General: Skin is warm.     Coloration: Skin is not jaundiced.     Findings: No lesion or rash.  Neurological:     General: No focal deficit present.     Mental Status: She is alert and oriented to person, place, and time. Mental status is at baseline.     Cranial Nerves: Cranial nerves are intact.  Psychiatric:        Mood and Affect: Mood normal.        Behavior: Behavior normal.        Thought Content: Thought content normal.     LABS:   CBC Latest Ref Rng & Units 07/30/2020 04/01/2020  WBC - 9.0 8.5  Hemoglobin 12.0 - 16.0 14.7 14.6  Hematocrit 36 - 46 44 43  Platelets 150 - 399 302 289   CMP Latest Ref Rng & Units 04/01/2020 11/01/2018  Glucose 65 - 99 mg/dL - 94  BUN 4 - 21  14 18  Creatinine 0.5 - 1.1 0.8 0.96  Sodium 137 - 147 137 141  Potassium 3.4 - 5.3 3.6 4.2  Chloride 99 - 108 102 101  CO2 13 - 22 25(A) 26  Calcium 8.7 - 10.7 9.4 9.2  Alkaline Phos 25 - 125 106 -  AST 13 - 35 24 -  ALT 7 - 35 35 -    Ref. Range 07/30/2020 14:06  Iron Latest Ref Range: 28 - 170 ug/dL 89  UIBC Latest Units: ug/dL 281  TIBC Latest Ref Range: 250 - 450 ug/dL 370  Saturation Ratios Latest Ref Range: 10.4 - 31.8 % 24  Ferritin Latest Ref Range: 11 - 307 ng/mL 23    ASSESSMENT & PLAN:   Assessment/Plan:  A 60 y.o. female with hemochromatosis (C282Y/C282Y).  When evaluating her iron parameters, her ferritin is below 50.  Her other iron parameters are normal as well.  Based upon this, I will hold her phlebotomies over these next few months.  I will see her back in 4 months to reassess her iron parameters. The patient understands all  the plans discussed today and is in agreement with them.      Brianni Manthe Macarthur Critchley, MD

## 2020-08-05 ENCOUNTER — Telehealth: Payer: Self-pay | Admitting: Oncology

## 2020-08-05 ENCOUNTER — Other Ambulatory Visit: Payer: Self-pay

## 2020-08-05 ENCOUNTER — Inpatient Hospital Stay (INDEPENDENT_AMBULATORY_CARE_PROVIDER_SITE_OTHER): Payer: BC Managed Care – PPO | Admitting: Oncology

## 2020-08-05 ENCOUNTER — Other Ambulatory Visit: Payer: Self-pay | Admitting: Oncology

## 2020-08-05 NOTE — Telephone Encounter (Signed)
Per 4/13 los next appt scheduled and confirmed with patient

## 2020-11-21 DIAGNOSIS — J069 Acute upper respiratory infection, unspecified: Secondary | ICD-10-CM | POA: Diagnosis not present

## 2020-11-21 DIAGNOSIS — Z20822 Contact with and (suspected) exposure to covid-19: Secondary | ICD-10-CM | POA: Diagnosis not present

## 2020-11-30 DIAGNOSIS — U071 COVID-19: Secondary | ICD-10-CM | POA: Diagnosis not present

## 2020-11-30 DIAGNOSIS — R059 Cough, unspecified: Secondary | ICD-10-CM | POA: Diagnosis not present

## 2020-12-01 ENCOUNTER — Telehealth: Payer: Self-pay | Admitting: Oncology

## 2020-12-01 NOTE — Telephone Encounter (Signed)
Patient called to Reschedule her 8/11 Labs, Follow Up Appt's to 8/23 due to having COVID

## 2020-12-03 ENCOUNTER — Inpatient Hospital Stay: Payer: BC Managed Care – PPO

## 2020-12-04 ENCOUNTER — Inpatient Hospital Stay: Payer: BC Managed Care – PPO | Admitting: Oncology

## 2020-12-09 DIAGNOSIS — E782 Mixed hyperlipidemia: Secondary | ICD-10-CM | POA: Diagnosis not present

## 2020-12-09 DIAGNOSIS — I10 Essential (primary) hypertension: Secondary | ICD-10-CM | POA: Diagnosis not present

## 2020-12-09 DIAGNOSIS — U099 Post covid-19 condition, unspecified: Secondary | ICD-10-CM | POA: Diagnosis not present

## 2020-12-09 DIAGNOSIS — R7303 Prediabetes: Secondary | ICD-10-CM | POA: Diagnosis not present

## 2020-12-15 ENCOUNTER — Inpatient Hospital Stay: Payer: BC Managed Care – PPO | Attending: Oncology

## 2020-12-15 ENCOUNTER — Inpatient Hospital Stay: Payer: BC Managed Care – PPO | Admitting: Oncology

## 2020-12-15 ENCOUNTER — Other Ambulatory Visit: Payer: Self-pay | Admitting: Oncology

## 2020-12-23 ENCOUNTER — Telehealth: Payer: Self-pay | Admitting: Oncology

## 2020-12-23 NOTE — Telephone Encounter (Signed)
Patient rescheduled Missed Appt's (pt had COVID).  Appt's rescheduled to 9/9 Labs, 9/14 Follow Up

## 2020-12-30 NOTE — Progress Notes (Signed)
Lori Freeman  [address] [place],  [place]  [address] [phone]  Clinic Day:  01/06/2021  Referring physician: Garwin Brothers, MD  This document serves as a record of services personally performed by Lori Breiner Macarthur Critchley, MD. It was created on their behalf by [facility] E, a trained medical scribe. The creation of this record is based on the scribe's personal observations and the provider's statements to them.  HISTORY OF PRESENT ILLNESS:  The patient is a 59 y.o. female with hemochromatosis (C282Y/C282Y).  She occasionally requires maintenance phlebotomies to keep this disease from causing a precipitous rise in her iron parameters.  She comes in today to reassess her iron parameters.  Since her last visit, the patient has been doing well.  With respect to her hemochromatosis, she denies having any systemic symptoms which concern her for disease-related complications.  PHYSICAL EXAM:  Blood pressure (!) 186/81, pulse 78, temperature 98.4 F (36.9 C), resp. rate 16, height '5\' 5"'$  (1.651 m), weight 170 lb 6.4 oz (77.3 kg), SpO2 97 %. Wt Readings from Last 3 Encounters:  01/06/21 170 lb 6.4 oz (77.3 kg)  08/05/20 168 lb 8 oz (76.4 kg)  04/02/20 167 lb 12 oz (76.1 kg)   Body mass index is 28.36 kg/m. Performance status (ECOG): 0 - Asymptomatic Physical Exam Constitutional:      Appearance: Normal appearance. She is not ill-appearing.  HENT:     Mouth/Throat:     Mouth: Mucous membranes are moist.     Pharynx: Oropharynx is clear. No oropharyngeal exudate or posterior oropharyngeal erythema.  Cardiovascular:     Rate and Rhythm: Normal rate and regular rhythm.     Heart sounds: No murmur heard.   No friction rub. No gallop.  Pulmonary:     Effort: Pulmonary effort is normal. No respiratory distress.     Breath sounds: Normal breath sounds. No wheezing, rhonchi or rales.  Abdominal:     General: Bowel sounds are normal. There is no distension.      Palpations: Abdomen is soft. There is no mass.     Tenderness: There is no abdominal tenderness.  Musculoskeletal:        General: No swelling.     Right lower leg: No edema.     Left lower leg: No edema.  Lymphadenopathy:     Cervical: No cervical adenopathy.     Upper Body:     Right upper body: No supraclavicular or axillary adenopathy.     Left upper body: No supraclavicular or axillary adenopathy.     Lower Body: No right inguinal adenopathy. No left inguinal adenopathy.  Skin:    General: Skin is warm.     Coloration: Skin is not jaundiced.     Findings: No lesion or rash.  Neurological:     General: No focal deficit present.     Mental Status: She is alert and oriented to person, place, and time. Mental status is at baseline.     Cranial Nerves: Cranial nerves are intact.  Psychiatric:        Mood and Affect: Mood normal.        Behavior: Behavior normal.        Thought Content: Thought content normal.    LABS:   CBC Latest Ref Rng & Units 01/01/2021 07/30/2020 04/01/2020  WBC - 8.5 9.0 8.5  Hemoglobin 12.0 - 16.0 13.6 14.7 14.6  Hematocrit 36 - 46 40 44 43  Platelets 150 - 399 257  302 289   CMP Latest Ref Rng & Units 01/01/2021 04/01/2020 11/01/2018  Glucose 65 - 99 mg/dL - - 94  BUN 4 - '21 20 14 18  '$ Creatinine 0.5 - 1.1 0.8 0.8 0.96  Sodium 137 - 147 139 137 141  Potassium 3.4 - 5.3 3.8 3.6 4.2  Chloride 99 - 108 105 102 101  CO2 13 - 22 25(A) 25(A) 26  Calcium 8.7 - 10.7 8.9 9.4 9.2  Alkaline Phos 25 - 125 101 106 -  AST 13 - 35 32 24 -  ALT 7 - 35 31 35 -    Ref. Range 07/30/2020 14:06 01/01/2021 10:50  Iron Latest Ref Range: 28 - 170 ug/dL 89 151  UIBC Latest Units: ug/dL 281 160  TIBC Latest Ref Range: 250 - 450 ug/dL 370 311  Saturation Ratios Latest Ref Range: 10.4 - 31.8 % 24 49 (H)  Ferritin Latest Ref Range: 11 - 307 ng/mL 23 40    ASSESSMENT & PLAN:  Assessment/Plan:  A 60 y.o. female with hemochromatosis (C282Y/C282Y).  When evaluating her iron  parameters, although her ferritin is below 50, her other iron parameters are elevated.  Based upon this, she will be phlebotomized this week and in 2 months to better normalize her iron parameters.  I will see her back in 4 months to reassess her iron parameters. The patient understands all the plans discussed today and is in agreement with them.     I, Rita Ohara, am acting as scribe for Marice Potter, MD    I have reviewed this report as typed by the medical scribe, and it is complete and accurate.  Lori Ratti Macarthur Critchley, MD

## 2021-01-01 ENCOUNTER — Inpatient Hospital Stay: Payer: BC Managed Care – PPO | Attending: Oncology

## 2021-01-01 ENCOUNTER — Other Ambulatory Visit: Payer: Self-pay | Admitting: Hematology and Oncology

## 2021-01-01 ENCOUNTER — Other Ambulatory Visit: Payer: Self-pay

## 2021-01-01 LAB — FERRITIN: Ferritin: 40 ng/mL (ref 11–307)

## 2021-01-01 LAB — BASIC METABOLIC PANEL
BUN: 20 (ref 4–21)
CO2: 25 — AB (ref 13–22)
Chloride: 105 (ref 99–108)
Creatinine: 0.8 (ref 0.5–1.1)
Glucose: 108
Potassium: 3.8 (ref 3.4–5.3)
Sodium: 139 (ref 137–147)

## 2021-01-01 LAB — HEPATIC FUNCTION PANEL
ALT: 31 (ref 7–35)
AST: 32 (ref 13–35)
Alkaline Phosphatase: 101 (ref 25–125)
Bilirubin, Total: 0.5

## 2021-01-01 LAB — IRON AND TIBC
Iron: 151 ug/dL (ref 28–170)
Saturation Ratios: 49 % — ABNORMAL HIGH (ref 10.4–31.8)
TIBC: 311 ug/dL (ref 250–450)
UIBC: 160 ug/dL

## 2021-01-01 LAB — CBC AND DIFFERENTIAL
HCT: 40 (ref 36–46)
Hemoglobin: 13.6 (ref 12.0–16.0)
Neutrophils Absolute: 4.76
Platelets: 257 (ref 150–399)
WBC: 8.5

## 2021-01-01 LAB — CBC: RBC: 4.74 (ref 3.87–5.11)

## 2021-01-01 LAB — COMPREHENSIVE METABOLIC PANEL
Albumin: 4.2 (ref 3.5–5.0)
Calcium: 8.9 (ref 8.7–10.7)

## 2021-01-06 ENCOUNTER — Other Ambulatory Visit: Payer: Self-pay

## 2021-01-06 ENCOUNTER — Telehealth: Payer: Self-pay | Admitting: Oncology

## 2021-01-06 ENCOUNTER — Other Ambulatory Visit: Payer: Self-pay | Admitting: Oncology

## 2021-01-06 ENCOUNTER — Inpatient Hospital Stay (INDEPENDENT_AMBULATORY_CARE_PROVIDER_SITE_OTHER): Payer: BC Managed Care – PPO | Admitting: Oncology

## 2021-01-06 NOTE — Telephone Encounter (Signed)
Per 9/14 LOS next appt scheduled and given to patient

## 2021-01-07 ENCOUNTER — Encounter: Payer: Self-pay | Admitting: Oncology

## 2021-01-07 ENCOUNTER — Inpatient Hospital Stay: Payer: BC Managed Care – PPO

## 2021-01-07 NOTE — Patient Instructions (Signed)

## 2021-01-07 NOTE — Progress Notes (Signed)
Lori Freeman presents today for phlebotomy per MD orders. Phlebotomy procedure started at 1438 and ended at 1443. 508 grams removed. Patient observed after procedure without any incident. See VS, BP dropped but pt asymptomatic, denies complaints. Patient tolerated procedure well. IV needle removed intact.

## 2021-01-08 DIAGNOSIS — Z23 Encounter for immunization: Secondary | ICD-10-CM | POA: Diagnosis not present

## 2021-03-08 DIAGNOSIS — R7303 Prediabetes: Secondary | ICD-10-CM | POA: Diagnosis not present

## 2021-03-08 DIAGNOSIS — T383X5A Adverse effect of insulin and oral hypoglycemic [antidiabetic] drugs, initial encounter: Secondary | ICD-10-CM | POA: Diagnosis not present

## 2021-03-08 DIAGNOSIS — R110 Nausea: Secondary | ICD-10-CM | POA: Diagnosis not present

## 2021-03-08 DIAGNOSIS — R42 Dizziness and giddiness: Secondary | ICD-10-CM | POA: Diagnosis not present

## 2021-03-09 ENCOUNTER — Inpatient Hospital Stay: Payer: BC Managed Care – PPO | Attending: Oncology

## 2021-03-09 ENCOUNTER — Other Ambulatory Visit: Payer: Self-pay

## 2021-03-09 NOTE — Progress Notes (Signed)
Lori Freeman presents today for phlebotomy per MD orders. Phlebotomy procedure started at 1412 and ended at 1430. 503 grams removed. Patient observed after procedure without any incident. Patient tolerated procedure well. IV needle removed intact, right AC

## 2021-03-22 DIAGNOSIS — R7303 Prediabetes: Secondary | ICD-10-CM | POA: Diagnosis not present

## 2021-03-22 DIAGNOSIS — I10 Essential (primary) hypertension: Secondary | ICD-10-CM | POA: Diagnosis not present

## 2021-03-22 DIAGNOSIS — J069 Acute upper respiratory infection, unspecified: Secondary | ICD-10-CM | POA: Diagnosis not present

## 2021-03-22 DIAGNOSIS — E782 Mixed hyperlipidemia: Secondary | ICD-10-CM | POA: Diagnosis not present

## 2021-05-03 NOTE — Progress Notes (Incomplete)
[facility]  [address] [place],  [place]  [address] [phone]  Clinic Day:  05/07/2021  Referring physician: Garwin Brothers, MD  This document serves as a record of services personally performed by Dequincy Macarthur Critchley, MD. It was created on their behalf by [facility] E, a trained medical scribe. The creation of this record is based on the scribe's personal observations and the provider's statements to them.  HISTORY OF PRESENT ILLNESS:  The patient is a 61 y.o. female with hemochromatosis (C282Y/C282Y).  She occasionally requires maintenance phlebotomies to keep this disease from causing a precipitous rise in her iron parameters.  She comes in today to reassess her iron parameters.  Since her last visit, the patient has been doing well.  With respect to her hemochromatosis, she denies having any systemic symptoms which concern her for disease-related complications.  PHYSICAL EXAM:  There were no vitals taken for this visit. Wt Readings from Last 3 Encounters:  01/06/21 170 lb 6.4 oz (77.3 kg)  08/05/20 168 lb 8 oz (76.4 kg)  04/02/20 167 lb 12 oz (76.1 kg)   There is no height or weight on file to calculate BMI. Performance status (ECOG): 0 - Asymptomatic Physical Exam Constitutional:      Appearance: Normal appearance. She is not ill-appearing.  HENT:     Mouth/Throat:     Mouth: Mucous membranes are moist.     Pharynx: Oropharynx is clear. No oropharyngeal exudate or posterior oropharyngeal erythema.  Cardiovascular:     Rate and Rhythm: Normal rate and regular rhythm.     Heart sounds: No murmur heard.   No friction rub. No gallop.  Pulmonary:     Effort: Pulmonary effort is normal. No respiratory distress.     Breath sounds: Normal breath sounds. No wheezing, rhonchi or rales.  Abdominal:     General: Bowel sounds are normal. There is no distension.     Palpations: Abdomen is soft. There is no mass.     Tenderness: There is no abdominal  tenderness.  Musculoskeletal:        General: No swelling.     Right lower leg: No edema.     Left lower leg: No edema.  Lymphadenopathy:     Cervical: No cervical adenopathy.     Upper Body:     Right upper body: No supraclavicular or axillary adenopathy.     Left upper body: No supraclavicular or axillary adenopathy.     Lower Body: No right inguinal adenopathy. No left inguinal adenopathy.  Skin:    General: Skin is warm.     Coloration: Skin is not jaundiced.     Findings: No lesion or rash.  Neurological:     General: No focal deficit present.     Mental Status: She is alert and oriented to person, place, and time. Mental status is at baseline.  Psychiatric:        Mood and Affect: Mood normal.        Behavior: Behavior normal.        Thought Content: Thought content normal.    LABS:   CBC Latest Ref Rng & Units 01/01/2021 07/30/2020 04/01/2020  WBC - 8.5 9.0 8.5  Hemoglobin 12.0 - 16.0 13.6 14.7 14.6  Hematocrit 36 - 46 40 44 43  Platelets 150 - 399 257 302 289   CMP Latest Ref Rng & Units 01/01/2021 04/01/2020 11/01/2018  Glucose 65 - 99 mg/dL - - 94  BUN 4 -  21 20 14 18   Creatinine 0.5 - 1.1 0.8 0.8 0.96  Sodium 137 - 147 139 137 141  Potassium 3.4 - 5.3 3.8 3.6 4.2  Chloride 99 - 108 105 102 101  CO2 13 - 22 25(A) 25(A) 26  Calcium 8.7 - 10.7 8.9 9.4 9.2  Alkaline Phos 25 - 125 101 106 -  AST 13 - 35 32 24 -  ALT 7 - 35 31 35 -    Ref. Range 07/30/2020 14:06 01/01/2021 10:50  Iron Latest Ref Range: 28 - 170 ug/dL 89 151  UIBC Latest Units: ug/dL 281 160  TIBC Latest Ref Range: 250 - 450 ug/dL 370 311  Saturation Ratios Latest Ref Range: 10.4 - 31.8 % 24 49 (H)  Ferritin Latest Ref Range: 11 - 307 ng/mL 23 40    ASSESSMENT & PLAN:  Assessment/Plan:  A 61 y.o. female with hemochromatosis (C282Y/C282Y).  When evaluating her iron parameters, although her ferritin is below 50, her other iron parameters are elevated.  Based upon this, she will be phlebotomized this week  and in 2 months to better normalize her iron parameters.  I will see her back in 4 months to reassess her iron parameters. The patient understands all the plans discussed today and is in agreement with them.     I, Rita Ohara, am acting as scribe for Marice Potter, MD    I have reviewed this report as typed by the medical scribe, and it is complete and accurate.  Dequincy Macarthur Critchley, MD

## 2021-05-04 ENCOUNTER — Other Ambulatory Visit: Payer: BC Managed Care – PPO

## 2021-05-07 ENCOUNTER — Telehealth: Payer: BC Managed Care – PPO | Admitting: Oncology

## 2021-05-12 DIAGNOSIS — J069 Acute upper respiratory infection, unspecified: Secondary | ICD-10-CM | POA: Diagnosis not present

## 2021-05-12 DIAGNOSIS — J309 Allergic rhinitis, unspecified: Secondary | ICD-10-CM | POA: Diagnosis not present

## 2021-06-11 DIAGNOSIS — R051 Acute cough: Secondary | ICD-10-CM | POA: Diagnosis not present

## 2021-07-27 ENCOUNTER — Inpatient Hospital Stay: Payer: BC Managed Care – PPO | Attending: Oncology

## 2021-07-27 LAB — HEPATIC FUNCTION PANEL
ALT: 31 U/L (ref 7–35)
AST: 31 (ref 13–35)
Alkaline Phosphatase: 86 (ref 25–125)
Bilirubin, Total: 0.6

## 2021-07-27 LAB — CBC AND DIFFERENTIAL
HCT: 43 (ref 36–46)
Hemoglobin: 13.9 (ref 12.0–16.0)
Neutrophils Absolute: 3.64
Platelets: 251 10*3/uL (ref 150–400)
WBC: 7

## 2021-07-27 LAB — FERRITIN: Ferritin: 22 ng/mL (ref 11–307)

## 2021-07-27 LAB — BASIC METABOLIC PANEL
BUN: 20 (ref 4–21)
CO2: 25 — AB (ref 13–22)
Chloride: 105 (ref 99–108)
Creatinine: 0.8 (ref 0.5–1.1)
Glucose: 113
Potassium: 3.9 mEq/L (ref 3.5–5.1)
Sodium: 139 (ref 137–147)

## 2021-07-27 LAB — CBC: RBC: 5.17 — AB (ref 3.87–5.11)

## 2021-07-27 LAB — IRON AND TIBC
Iron: 100 ug/dL (ref 28–170)
Saturation Ratios: 28 % (ref 10.4–31.8)
TIBC: 352 ug/dL (ref 250–450)
UIBC: 252 ug/dL

## 2021-07-27 LAB — COMPREHENSIVE METABOLIC PANEL
Albumin: 4.2 (ref 3.5–5.0)
Calcium: 9.1 (ref 8.7–10.7)

## 2021-08-03 NOTE — Progress Notes (Signed)
?[facility]  ?[address] ?[place],  [place]  [address] ?[phone] ? ?Clinic Day:  08/04/2021 ? ?Referring physician: Garwin Brothers, MD ? ?HISTORY OF PRESENT ILLNESS:  ?The patient is a 61 y.o. female with hemochromatosis (C282Y/C282Y).  She occasionally requires maintenance phlebotomies to keep her disease from causing a precipitous rise in her iron parameters.  She comes in today to reassess her iron parameters.  Since her last visit, the patient has been doing fairly well.  With respect to her hemochromatosis, she denies having any systemic symptoms which concern her for disease-related complications. ? ?PHYSICAL EXAM:  ?Blood pressure (!) 164/78, pulse 76, temperature 98.9 ?F (37.2 ?C), temperature source Oral, resp. rate 18, height '5\' 5"'$  (1.651 m), weight 172 lb 3.2 oz (78.1 kg), SpO2 95 %. ?Wt Readings from Last 3 Encounters:  ?08/04/21 172 lb 3.2 oz (78.1 kg)  ?01/06/21 170 lb 6.4 oz (77.3 kg)  ?08/05/20 168 lb 8 oz (76.4 kg)  ? ?Body mass index is 28.66 kg/m?Marland Kitchen ?Performance status (ECOG): 0 - Asymptomatic ?Physical Exam ?Constitutional:   ?   Appearance: Normal appearance. She is not ill-appearing.  ?HENT:  ?   Mouth/Throat:  ?   Mouth: Mucous membranes are moist.  ?   Pharynx: Oropharynx is clear. No oropharyngeal exudate or posterior oropharyngeal erythema.  ?Cardiovascular:  ?   Rate and Rhythm: Normal rate and regular rhythm.  ?   Heart sounds: No murmur heard. ?  No friction rub. No gallop.  ?Pulmonary:  ?   Effort: Pulmonary effort is normal. No respiratory distress.  ?   Breath sounds: Normal breath sounds. No wheezing, rhonchi or rales.  ?Abdominal:  ?   General: Bowel sounds are normal. There is no distension.  ?   Palpations: Abdomen is soft. There is no mass.  ?   Tenderness: There is no abdominal tenderness.  ?Musculoskeletal:     ?   General: No swelling.  ?   Right lower leg: No edema.  ?   Left lower leg: No edema.  ?Lymphadenopathy:  ?   Cervical: No  cervical adenopathy.  ?   Upper Body:  ?   Right upper body: No supraclavicular or axillary adenopathy.  ?   Left upper body: No supraclavicular or axillary adenopathy.  ?   Lower Body: No right inguinal adenopathy. No left inguinal adenopathy.  ?Skin: ?   General: Skin is warm.  ?   Coloration: Skin is not jaundiced.  ?   Findings: No lesion or rash.  ?Neurological:  ?   General: No focal deficit present.  ?   Mental Status: She is alert and oriented to person, place, and time. Mental status is at baseline.  ?Psychiatric:     ?   Mood and Affect: Mood normal.     ?   Behavior: Behavior normal.     ?   Thought Content: Thought content normal.  ? ? ?LABS:  ? ? ?  Latest Ref Rng & Units 07/27/2021  ? 12:00 AM 01/01/2021  ? 12:00 AM 07/30/2020  ? 12:00 AM  ?CBC  ?WBC  7.0      8.5      9.0       ?Hemoglobin 12.0 - 16.0 13.9      13.6      14.7       ?Hematocrit 36 - 46 43      40      44       ?  Platelets 150 - 400 K/uL 251      257      302       ?  ? This result is from an external source.  ? ? ?  Latest Ref Rng & Units 07/27/2021  ? 12:00 AM 01/01/2021  ? 12:00 AM 04/01/2020  ? 12:00 AM  ?CMP  ?BUN 4 - '21 20      20      14       '$ ?Creatinine 0.5 - 1.1 0.8      0.8      0.8       ?Sodium 137 - 147 139      139      137       ?Potassium 3.5 - 5.1 mEq/L 3.9      3.8      3.6       ?Chloride 99 - 108 105      105      102       ?CO2 13 - '22 25      25      25       '$ ?Calcium 8.7 - 10.7 9.1      8.9      9.4       ?Alkaline Phos 25 - 125 86      101      106       ?AST 13 - 35 31      32      24       ?ALT 7 - 35 U/L 31      31      35       ?  ? This result is from an external source.  ? ? Latest Reference Range & Units 07/27/21 11:21  ?Iron 28 - 170 ug/dL 100  ?UIBC ug/dL 252  ?TIBC 250 - 450 ug/dL 352  ?Saturation Ratios 10.4 - 31.8 % 28  ?Ferritin 11 - 307 ng/mL 22  ? ? ?ASSESSMENT & PLAN:  ?Assessment/Plan:  A 61 y.o. female with hemochromatosis (C282Y/C282Y).  When evaluating her iron parameters, I am pleased her ferritin is  below 50.  Her other iron parameters are not particularly elevated.  Based upon this, she I will hold her phlebotomies over the next 4 months.  I will see her back in 4 months to reassess her iron parameters. The patient understands all the plans discussed today and is in agreement with them.   ? ?Lakeisa Heninger Macarthur Critchley, MD ? ? ? ?  ?

## 2021-08-04 ENCOUNTER — Inpatient Hospital Stay (INDEPENDENT_AMBULATORY_CARE_PROVIDER_SITE_OTHER): Payer: BC Managed Care – PPO | Admitting: Oncology

## 2021-08-04 ENCOUNTER — Other Ambulatory Visit: Payer: Self-pay | Admitting: Oncology

## 2021-08-04 ENCOUNTER — Telehealth: Payer: Self-pay | Admitting: Oncology

## 2021-08-04 ENCOUNTER — Other Ambulatory Visit: Payer: Self-pay

## 2021-08-04 NOTE — Telephone Encounter (Signed)
Patient has been scheduled for follow-up visit per 08/04/21 los. Pt given an appt calendar with date and time. ? ?

## 2021-12-03 ENCOUNTER — Inpatient Hospital Stay: Payer: BC Managed Care – PPO | Attending: Internal Medicine

## 2021-12-06 ENCOUNTER — Ambulatory Visit: Payer: BC Managed Care – PPO | Admitting: Oncology

## 2022-01-06 DIAGNOSIS — R059 Cough, unspecified: Secondary | ICD-10-CM | POA: Diagnosis not present

## 2022-01-06 DIAGNOSIS — J309 Allergic rhinitis, unspecified: Secondary | ICD-10-CM | POA: Diagnosis not present

## 2022-01-06 DIAGNOSIS — J0111 Acute recurrent frontal sinusitis: Secondary | ICD-10-CM | POA: Diagnosis not present

## 2022-01-14 DIAGNOSIS — R059 Cough, unspecified: Secondary | ICD-10-CM | POA: Diagnosis not present

## 2022-01-14 DIAGNOSIS — I700 Atherosclerosis of aorta: Secondary | ICD-10-CM | POA: Diagnosis not present

## 2022-01-14 DIAGNOSIS — J3489 Other specified disorders of nose and nasal sinuses: Secondary | ICD-10-CM | POA: Diagnosis not present

## 2022-01-14 DIAGNOSIS — R0989 Other specified symptoms and signs involving the circulatory and respiratory systems: Secondary | ICD-10-CM | POA: Diagnosis not present

## 2022-02-17 DIAGNOSIS — R7303 Prediabetes: Secondary | ICD-10-CM | POA: Diagnosis not present

## 2022-02-17 DIAGNOSIS — Z23 Encounter for immunization: Secondary | ICD-10-CM | POA: Diagnosis not present

## 2022-02-17 DIAGNOSIS — E782 Mixed hyperlipidemia: Secondary | ICD-10-CM | POA: Diagnosis not present

## 2022-02-22 DIAGNOSIS — H25811 Combined forms of age-related cataract, right eye: Secondary | ICD-10-CM | POA: Diagnosis not present

## 2022-03-10 DIAGNOSIS — H25811 Combined forms of age-related cataract, right eye: Secondary | ICD-10-CM | POA: Diagnosis not present

## 2022-03-10 DIAGNOSIS — H269 Unspecified cataract: Secondary | ICD-10-CM | POA: Diagnosis not present

## 2022-04-26 ENCOUNTER — Other Ambulatory Visit: Payer: Self-pay

## 2022-04-26 MED ORDER — MONTELUKAST SODIUM 10 MG PO TABS: 10.0000 mg | ORAL_TABLET | Freq: Every day | ORAL | 0 refills | Status: DC

## 2022-04-27 ENCOUNTER — Encounter: Payer: Self-pay | Admitting: Allergy and Immunology

## 2022-04-27 ENCOUNTER — Other Ambulatory Visit: Payer: Self-pay | Admitting: Allergy and Immunology

## 2022-04-27 ENCOUNTER — Other Ambulatory Visit: Payer: Self-pay

## 2022-04-27 ENCOUNTER — Ambulatory Visit (INDEPENDENT_AMBULATORY_CARE_PROVIDER_SITE_OTHER): Payer: BC Managed Care – PPO | Admitting: Allergy and Immunology

## 2022-04-27 VITALS — BP 136/82 | HR 80 | Resp 18 | Ht 61.6 in | Wt 179.0 lb

## 2022-04-27 DIAGNOSIS — J3089 Other allergic rhinitis: Secondary | ICD-10-CM

## 2022-04-27 DIAGNOSIS — K219 Gastro-esophageal reflux disease without esophagitis: Secondary | ICD-10-CM

## 2022-04-27 DIAGNOSIS — J4489 Other specified chronic obstructive pulmonary disease: Secondary | ICD-10-CM | POA: Diagnosis not present

## 2022-04-27 MED ORDER — OMEPRAZOLE 40 MG PO CPDR: DELAYED_RELEASE_CAPSULE | ORAL | 5 refills | Status: DC

## 2022-04-27 MED ORDER — TRELEGY ELLIPTA 200-62.5-25 MCG/ACT IN AEPB: INHALATION_SPRAY | RESPIRATORY_TRACT | 5 refills | Status: DC

## 2022-04-27 MED ORDER — AMLODIPINE BESYLATE 5 MG PO TABS: 5.0000 mg | ORAL_TABLET | Freq: Every day | ORAL | 1 refills | Status: DC

## 2022-04-27 MED ORDER — AIRSUPRA 90-80 MCG/ACT IN AERO: 2.0000 | INHALATION_SPRAY | RESPIRATORY_TRACT | 1 refills | Status: DC | PRN

## 2022-04-27 MED ORDER — RYALTRIS 665-25 MCG/ACT NA SUSP: NASAL | 5 refills | Status: DC

## 2022-04-27 NOTE — Progress Notes (Unsigned)
[place]   Dear Gavin Pound,  Thank you for referring Lori Freeman to the [facility] on 04/27/2022.   Below is a summation of this patient's evaluation and recommendations.  Thank you for your referral. I will keep you informed about this patient's response to treatment.   If you have any questions please do not hesitate to contact me.   Sincerely,  Jiles Prows, MD Allergy / Immunology [place] [facility]   ______________________________________________________________________    NEW PATIENT NOTE  Referring Provider: Jaclynn Major, NP Primary Provider: Garwin Brothers, MD Date of office visit: 04/27/2022    Subjective:   Chief Complaint:  Lori Freeman (DOB: 1961/02/23) is a 62 y.o. female who presents to the clinic on 04/27/2022 with a chief complaint of Post nasal drip .     HPI: Lucella presents to this clinic in evaluation of drainage.  She complains of having drainage running down her throat.  He feels as though is a constant drip.  She also has throat clearing and intermittent raspy voice and a cough associated with this issue and it has been present for years.  She has tried antihistamines and nasal sprays and Singulair which has not helped this issue.  She also has some nasal stuffiness but not really tremendous amount of sneezing.  She does not have any anosmia or ugly nasal discharge.  She is tried a nasal steroid which has helped her nose somewhat.  She has reflux.  She uses omeprazole 1 or 2 times per day and is still an active issue.  She regurgitates.  Sounds as though she has obstruction of swallowing on occasion.  Apparently she had an upper endoscopy in 2018 and everything was "okay".  She drinks 1 coffee twice a day and has [facility] 4 times a day and has chocolate several times per week.  She  stopped smoking in June 2023 after a prolonged history of this hobby.  She used Chantix to stop smoking.  She was having issues with intermittent wheezing and chest tightness especially whenever she caught a viral respiratory tract infection especially in association with COVID infection in 2022, February 2023, and October 2023.  She has been given a short acting bronchodilator which she uses whenever she develops an viral upper respiratory tract infection.  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   albuterol (2.5 MG/3ML) 0.083% nebulizer solution Commonly known as: PROVENTIL SMARTSIG:3 Milliliter(s) Via Nebulizer 3 Times Daily PRN   amLODipine 5 MG tablet Commonly known as: NORVASC Take 1 tablet (5 mg total) by mouth daily.   atorvastatin 40 MG tablet Commonly known as: LIPITOR Take 40 mg by mouth daily at 6 PM.   CORICIDIN HBP COLD/FLU PO Take by mouth  as needed.   ENERGY B12 PO Take by mouth daily.   famotidine 40 MG tablet Commonly known as: PEPCID Take 40 mg by mouth at bedtime.   hydrochlorothiazide 12.5 MG tablet Commonly known as: HYDRODIURIL Take 12.5 mg by mouth daily.   metoprolol tartrate 50 MG tablet Commonly known as: Lopressor Take 2 tablets (100 mg total) by mouth once for 1 dose.   montelukast 10 MG tablet Commonly known as: SINGULAIR Take 1 tablet (10 mg total) by mouth at bedtime.   nitroGLYCERIN 0.4 MG SL tablet Commonly known as:  NITROSTAT Place 1 tablet (0.4 mg total) under the tongue every 5 (five) minutes as needed for chest pain.   omeprazole 20 MG capsule Commonly known as: PRILOSEC Take 20 mg by mouth daily.   Potassium 99 MG Tabs Take by mouth daily.   potassium chloride 10 MEQ tablet Commonly known as: KLOR-CON M Take 10 mEq by mouth daily.   traZODone 100 MG tablet Commonly known as: DESYREL Take 100 mg by mouth at bedtime.   Vitamin D3 50 MCG (2000 UT) capsule Take 2,000 Units by mouth daily.   WOMENS MULTIVITAMIN PO Take by mouth daily.   AIRBORNE PO Take by mouth daily.     Review of systems negative except as noted in HPI / PMHx or noted below:  Review of Systems  Constitutional: Negative.   HENT: Negative.    Eyes: Negative.   Respiratory: Negative.    Cardiovascular: Negative.   Gastrointestinal: Negative.   Genitourinary: Negative.   Musculoskeletal: Negative.   Skin: Negative.   Neurological: Negative.   Endo/Heme/Allergies: Negative.   Psychiatric/Behavioral: Negative.      Family History  Problem Relation Age of Onset   Dementia Mother    Hypertension Father    Heart disease Father    Cancer Daughter     Social History   Socioeconomic History   Marital status: Widowed    Spouse name: Not on file   Number of children: Not on file   Years of education: Not on file   Highest education level: Not on file  Occupational History   Not on file  Tobacco Use   Smoking status: Former    Types: Cigarettes    Quit date: 10/22/2021    Years since quitting: 0.5   Smokeless tobacco: Never  Substance and Sexual Activity   Alcohol use: Yes    Comment: Rarely   Drug use: Never   Sexual activity: Not on file  Other Topics Concern   Not on file  Social History Narrative   Lives   Caffeine use:    Social Determinants of Health   Financial Resource Strain: Not on file  Food Insecurity: Not on file  Transportation Needs: Not on file  Physical Activity: Not on  file  Stress: Not on file  Social Connections: Not on file  Intimate Partner Violence: Not on file    Environmental and Social history  Lives in a house with a dry environment, dogs and cats inside the household, no carpet in the bedroom, no plastic on the bed, no plastic on the pillow, and no smoking ongoing with inside the household.  She works at a [employer] is exposed to fumes and has been having these exposures for the past 7 years.  Objective:   Vitals:   04/27/22 1451  BP: (!) 152/84  Pulse: 80  Resp: 18  SpO2: 95%   Height: 5' 1.6" (156.5 cm) Weight: 179 lb (81.2  kg)  Physical Exam Constitutional:      Appearance: She is not diaphoretic.     Comments: Throat clearing  HENT:     Head: Normocephalic.     Right Ear: Tympanic membrane, ear canal and external ear normal.     Left Ear: Tympanic membrane, ear canal and external ear normal.     Nose: Nose normal. No mucosal edema or rhinorrhea.     Mouth/Throat:     Pharynx: Uvula midline. No oropharyngeal exudate.  Eyes:     Conjunctiva/sclera: Conjunctivae normal.  Neck:     Thyroid: No thyromegaly.     Trachea: Trachea normal. No tracheal tenderness or tracheal deviation.  Cardiovascular:     Rate and Rhythm: Normal rate and regular rhythm.     Heart sounds: Normal heart sounds, S1 normal and S2 normal. No murmur heard. Pulmonary:     Effort: No respiratory distress.     Breath sounds: Normal breath sounds. No stridor. No wheezing or rales.  Lymphadenopathy:     Head:     Right side of head: No tonsillar adenopathy.     Left side of head: No tonsillar adenopathy.     Cervical: No cervical adenopathy.  Skin:    Findings: No erythema or rash.     Nails: There is no clubbing.  Neurological:     Mental Status: She is alert.     Diagnostics: Allergy skin tests were performed.   Spirometry was performed and demonstrated an FEV1 of 1.40 @ 63 % of predicted. FEV1/FVC = 0.77  Assessment and Plan:     No diagnosis found.  Patient Instructions   1.  Allergen avoidance measures  2.  Treat and prevent inflammation of airway:   A. Ryaltris - 2 sprays each nostril 1-2 times per day (specialty pharmacy)  B. Trelegy 200 - 1 inhalation 1 time per day (empty lungs)  3.  Treat and prevent reflux/LPR:  A. Omeprazole 40 mg - 1 tablet 2 times per day  B. Famotidine 40 mg - 1 tablet 1 time per day in evening  C. Decrease caffeine and chocolate consumption  4.  If needed:   A. AirSupra - 2 inhalations every 4-6 hours (Coupon)  B. Cetirizine 10 mg - 1 tablet 1 time per day  5.  Return to clinic in 4 weeks or earlier if problem  6.  Obtain flu vaccine, RSV vaccine, COVID-vaccine   Jiles Prows, MD Allergy / Immunology [place] [facility] [place]

## 2022-04-27 NOTE — Patient Instructions (Addendum)
  1.  Allergen avoidance measures???  2.  Treat and prevent inflammation of airway:   A. Ryaltris - 2 sprays each nostril 1-2 times per day (specialty pharmacy)  B. Trelegy 200 - 1 inhalation 1 time per day (empty lungs)  3.  Treat and prevent reflux/LPR:  A. Omeprazole 40 mg - 1 tablet 2 times per day  B. Famotidine 40 mg - 1 tablet 1 time per day in evening  C. Decrease caffeine and chocolate consumption  4.  If needed:   A. AirSupra - 2 inhalations every 4-6 hours (Coupon)  B. Cetirizine 10 mg - 1 tablet 1 time per day  5.  Return to clinic in 4 weeks or earlier if problem  6.  Obtain flu vaccine, RSV vaccine, COVID-vaccine

## 2022-04-28 ENCOUNTER — Encounter: Payer: Self-pay | Admitting: Allergy and Immunology

## 2022-05-01 DIAGNOSIS — B9689 Other specified bacterial agents as the cause of diseases classified elsewhere: Secondary | ICD-10-CM | POA: Diagnosis not present

## 2022-05-01 DIAGNOSIS — J019 Acute sinusitis, unspecified: Secondary | ICD-10-CM | POA: Diagnosis not present

## 2022-05-01 DIAGNOSIS — R058 Other specified cough: Secondary | ICD-10-CM | POA: Diagnosis not present

## 2022-05-03 NOTE — Telephone Encounter (Signed)
I called the patient to inform her the omeprazole is not covered by her insurance, but is available over the counter.

## 2022-05-16 ENCOUNTER — Ambulatory Visit: Payer: BC Managed Care – PPO | Admitting: Internal Medicine

## 2022-05-25 ENCOUNTER — Other Ambulatory Visit: Payer: Self-pay

## 2022-05-25 MED ORDER — ATORVASTATIN CALCIUM 40 MG PO TABS: 40.0000 mg | ORAL_TABLET | Freq: Every day | ORAL | 2 refills | Status: AC

## 2022-05-26 ENCOUNTER — Ambulatory Visit: Payer: BC Managed Care – PPO | Admitting: Allergy and Immunology

## 2022-05-26 DIAGNOSIS — J309 Allergic rhinitis, unspecified: Secondary | ICD-10-CM

## 2022-05-29 ENCOUNTER — Other Ambulatory Visit: Payer: Self-pay | Admitting: Oncology

## 2022-05-29 NOTE — Progress Notes (Unsigned)
[facility]  [address] [place],  [place]  [address] [phone]  Clinic Day:  05/30/2022  Referring physician: Garwin Brothers, MD  HISTORY OF PRESENT ILLNESS:  The patient is a 62 y.o. female with hemochromatosis (C282Y/C282Y).  She occasionally requires maintenance phlebotomies to keep her disease from causing a precipitous rise in her iron parameters.  She comes in today to reassess her iron parameters.  Since her last visit, the patient has been doing fairly well.  With respect to her hemochromatosis, she denies having any systemic symptoms which concern her for disease-related complications.  PHYSICAL EXAM:  Blood pressure (!) 175/77, pulse 77, temperature 98.6 F (37 C), resp. rate 16, height 5' 1.6" (1.565 m), weight 176 lb 6.4 oz (80 kg), SpO2 95 %. Wt Readings from Last 3 Encounters:  05/30/22 176 lb 6.4 oz (80 kg)  04/27/22 179 lb (81.2 kg)  08/04/21 172 lb 3.2 oz (78.1 kg)   Body mass index is 32.68 kg/m. Performance status (ECOG): 0 - Asymptomatic Physical Exam Constitutional:      Appearance: Normal appearance. She is not ill-appearing.  HENT:     Mouth/Throat:     Mouth: Mucous membranes are moist.     Pharynx: Oropharynx is clear. No oropharyngeal exudate or posterior oropharyngeal erythema.  Cardiovascular:     Rate and Rhythm: Normal rate and regular rhythm.     Heart sounds: No murmur heard.    No friction rub. No gallop.  Pulmonary:     Effort: Pulmonary effort is normal. No respiratory distress.     Breath sounds: Normal breath sounds. No wheezing, rhonchi or rales.  Abdominal:     General: Bowel sounds are normal. There is no distension.     Palpations: Abdomen is soft. There is no mass.     Tenderness: There is no abdominal tenderness.  Musculoskeletal:        General: No swelling.     Right lower leg: No edema.     Left lower leg: No edema.  Lymphadenopathy:     Cervical: No cervical adenopathy.     Upper Body:      Right upper body: No supraclavicular or axillary adenopathy.     Left upper body: No supraclavicular or axillary adenopathy.     Lower Body: No right inguinal adenopathy. No left inguinal adenopathy.  Skin:    General: Skin is warm.     Coloration: Skin is not jaundiced.     Findings: No lesion or rash.  Neurological:     General: No focal deficit present.     Mental Status: She is alert and oriented to person, place, and time. Mental status is at baseline.  Psychiatric:        Mood and Affect: Mood normal.        Behavior: Behavior normal.        Thought Content: Thought content normal.     LABS:      Latest Ref Rng & Units 05/30/2022   12:00 AM 07/27/2021   12:00 AM 01/01/2021   12:00 AM  CBC  WBC  9.4     7.0     8.5      Hemoglobin 12.0 - 16.0 13.7     13.9     13.6      Hematocrit 36 - 46 40     43     40      Platelets 150 - 400 K/uL 284  251     257         This result is from an external source.      Latest Ref Rng & Units 07/27/2021   12:00 AM 01/01/2021   12:00 AM 04/01/2020   12:00 AM  CMP  BUN 4 - '21 20     20     14      '$ Creatinine 0.5 - 1.1 0.8     0.8     0.8      Sodium 137 - 147 139     139     137      Potassium 3.5 - 5.1 mEq/L 3.9     3.8     3.6      Chloride 99 - 108 105     105     102      CO2 13 - '22 25     25     25      '$ Calcium 8.7 - 10.7 9.1     8.9     9.4      Alkaline Phos 25 - 125 86     101     106      AST 13 - 35 31     32     24      ALT 7 - 35 U/L 31     31     35         This result is from an external source.   IRON STUDIES PENDING  ASSESSMENT & PLAN:  Assessment/Plan:  A 62 y.o. female with hemochromatosis (C282Y/C282Y).  When evaluating her iron parameters,     I will see her back in 4 months to reassess her iron parameters. The patient understands all the plans discussed today and is in agreement with them.    Lori Goldwasser Macarthur Critchley, MD

## 2022-05-30 ENCOUNTER — Inpatient Hospital Stay: Payer: BC Managed Care – PPO

## 2022-05-30 ENCOUNTER — Inpatient Hospital Stay: Payer: BC Managed Care – PPO | Attending: Oncology | Admitting: Oncology

## 2022-05-30 LAB — CMP (CANCER CENTER ONLY)
ALT: 29 U/L (ref 0–44)
AST: 22 U/L (ref 15–41)
Albumin: 4.3 g/dL (ref 3.5–5.0)
Alkaline Phosphatase: 70 U/L (ref 38–126)
Anion gap: 9 (ref 5–15)
BUN: 23 mg/dL (ref 8–23)
CO2: 25 mmol/L (ref 22–32)
Calcium: 9 mg/dL (ref 8.9–10.3)
Chloride: 106 mmol/L (ref 98–111)
Creatinine: 0.82 mg/dL (ref 0.44–1.00)
GFR, Estimated: >60 mL/min (ref >60)
Glucose, Bld: 92 mg/dL (ref 70–99)
Potassium: 3.7 mmol/L (ref 3.5–5.1)
Sodium: 140 mmol/L (ref 135–145)
Total Bilirubin: 0.6 mg/dL (ref 0.3–1.2)
Total Protein: 7 g/dL (ref 6.5–8.1)

## 2022-05-30 LAB — CBC AND DIFFERENTIAL
HCT: 40 (ref 36–46)
Hemoglobin: 13.7 (ref 12.0–16.0)
Neutrophils Absolute: 5.92
Platelets: 284 10*3/uL (ref 150–400)
WBC: 9.4

## 2022-05-30 LAB — FERRITIN: Ferritin: 39 ng/mL (ref 11–307)

## 2022-05-30 LAB — CBC: RBC: 4.75 (ref 3.87–5.11)

## 2022-05-30 LAB — IRON AND TIBC
Iron: 106 ug/dL (ref 28–170)
Saturation Ratios: 30 % (ref 10.4–31.8)
TIBC: 350 ug/dL (ref 250–450)
UIBC: 244 ug/dL

## 2022-05-31 ENCOUNTER — Encounter: Payer: Self-pay | Admitting: Oncology

## 2022-05-31 ENCOUNTER — Telehealth: Payer: Self-pay

## 2022-05-31 NOTE — Telephone Encounter (Signed)
Dr Bobby Rumpf reviewed pt's iron labs. He states, "She can go another 4 months without phlebotomy, as her ferritin is only 39. Make sure you give her that number". I notified pt of Dr Bobby Rumpf' response. Pt replied, "that's great".    Latest Reference Range & Units 05/30/22 14:40  Iron 28 - 170 ug/dL 106  UIBC ug/dL 244  TIBC 250 - 450 ug/dL 350  Saturation Ratios 10.4 - 31.8 % 30  Ferritin 11 - 307 ng/mL 39

## 2022-06-02 DIAGNOSIS — H269 Unspecified cataract: Secondary | ICD-10-CM | POA: Diagnosis not present

## 2022-06-02 DIAGNOSIS — H25812 Combined forms of age-related cataract, left eye: Secondary | ICD-10-CM | POA: Diagnosis not present

## 2022-06-02 HISTORY — PX: CATARACT EXTRACTION: SUR2

## 2022-06-08 ENCOUNTER — Ambulatory Visit (INDEPENDENT_AMBULATORY_CARE_PROVIDER_SITE_OTHER): Payer: BC Managed Care – PPO | Admitting: Allergy and Immunology

## 2022-06-08 ENCOUNTER — Encounter: Payer: Self-pay | Admitting: Allergy and Immunology

## 2022-06-08 VITALS — BP 140/64 | HR 76 | Resp 18

## 2022-06-08 DIAGNOSIS — J3089 Other allergic rhinitis: Secondary | ICD-10-CM | POA: Diagnosis not present

## 2022-06-08 DIAGNOSIS — K219 Gastro-esophageal reflux disease without esophagitis: Secondary | ICD-10-CM

## 2022-06-08 DIAGNOSIS — J4489 Other specified chronic obstructive pulmonary disease: Secondary | ICD-10-CM

## 2022-06-08 NOTE — Progress Notes (Unsigned)
[place]   Follow-up Note  Referring Provider: Garwin Brothers, MD Primary Provider: Garwin Brothers, MD Date of Office Visit: 06/08/2022  Subjective:   Lori Freeman (DOB: January 27, 1961) is a 62 y.o. female who returns to the [facility] on 06/08/2022 in re-evaluation of the following:  HPI: Lori Freeman returns to this clinic in evaluation of COPD, asthma, allergic rhinitis, LPR.  I last saw her in this clinic during her initial evaluation on 27 April 2022.  She is doing great and she has resolved her drainage which was her big issue when I saw her last.  She had very little issues with her nose and very little issues with her chest and overall feels great.  She feels so good that she is basically tapered off most of her medications.  Allergies as of 06/08/2022       Reactions   Bupropion Hives        Medication List    Airsupra 90-80 MCG/ACT Aero Generic drug: Albuterol-Budesonide Inhale 2 puffs into the lungs as needed (every four to six hours for cough or wheeze.  Rinse, gargle, and spit after use).   albuterol 108 (90 Base) MCG/ACT inhaler Commonly known as: VENTOLIN HFA Inhale into the lungs as needed.   albuterol (2.5 MG/3ML) 0.083% nebulizer solution Commonly known as: PROVENTIL SMARTSIG:3 Milliliter(s) Via Nebulizer 3 Times Daily PRN   amLODipine 5 MG tablet Commonly known as: NORVASC Take 1 tablet (5 mg total) by mouth daily.   atorvastatin 40 MG tablet Commonly known as: LIPITOR Take 1 tablet (40 mg total) by mouth daily at 6 PM.   CORICIDIN HBP COLD/FLU PO Take by mouth as needed.   ENERGY B12 PO Take by mouth daily.   famotidine 40 MG tablet Commonly known as: PEPCID Take 40 mg by mouth at bedtime.   hydrochlorothiazide 12.5 MG tablet Commonly known as: HYDRODIURIL Take 12.5 mg by mouth daily.   ketorolac 0.5 % ophthalmic solution Commonly known as: ACULAR PLEASE SEE ATTACHED  FOR DETAILED DIRECTIONS   ofloxacin 0.3 % ophthalmic solution Commonly known as: OCUFLOX PLEASE SEE ATTACHED FOR DETAILED DIRECTIONS   omeprazole 40 MG capsule Commonly known as: PRILOSEC Take one capsule twice daily as directed.   Potassium 99 MG Tabs Take by mouth daily.   prednisoLONE acetate 1 % ophthalmic suspension Commonly known as: PRED FORTE Place into the left eye.   Ryaltris [id number] MCG/ACT Susp Generic drug: Olopatadine-Mometasone Use two sprays in each nostril one to two times daily as directed.   traZODone 100 MG tablet Commonly known as: DESYREL Take 100 mg by mouth at bedtime.   Trelegy Ellipta 200-62.5-25 MCG/ACT Aepb Generic drug: Fluticasone-Umeclidin-Vilant Inhale one dose once daily to prevent cough or wheeze.  Rinse, gargle, and spit after use.   Vitamin D3 50 MCG (2000 UT) Caps Take 2,000 Units by mouth daily.   WOMENS MULTIVITAMIN PO Take by mouth daily.   AIRBORNE PO Take by mouth daily.    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   HENT: Negative.    Eyes: Negative.   Respiratory: Negative.    Cardiovascular: Negative.   Gastrointestinal: Negative.   Genitourinary: Negative.   Musculoskeletal: Negative.   Skin: Negative.   Neurological: Negative.   Endo/Heme/Allergies: Negative.   Psychiatric/Behavioral: Negative.       Objective:   Vitals:   06/08/22 1507  BP: (!) 140/64  Pulse: 76  Resp: 18  SpO2: 95%          Physical  Exam Constitutional:      Appearance: She is not diaphoretic.  HENT:     Head: Normocephalic.     Right Ear: Tympanic membrane, ear canal and external ear normal.     Left Ear: Tympanic membrane, ear canal and external ear normal.     Nose: Nose normal. No mucosal edema or rhinorrhea.     Mouth/Throat:     Pharynx: Uvula midline. No oropharyngeal exudate.  Eyes:     Conjunctiva/sclera: Conjunctivae normal.  Neck:     Thyroid: No thyromegaly.     Trachea: Trachea normal. No tracheal tenderness or tracheal deviation.  Cardiovascular:     Rate and Rhythm: Normal rate and regular rhythm.     Heart sounds: Normal heart sounds, S1 normal and S2 normal. No murmur heard. Pulmonary:     Effort: No respiratory distress.     Breath sounds: Normal breath sounds. No stridor. No wheezing or rales.  Lymphadenopathy:     Head:     Right side of head: No tonsillar adenopathy.     Left side of head: No tonsillar adenopathy.     Cervical: No cervical adenopathy.  Skin:    Findings: No erythema or rash.     Nails: There is no clubbing.  Neurological:     Mental Status: She is alert.     Diagnostics:    Spirometry was performed and demonstrated an FEV1 of 1.66 at 74 % of predicted.  Assessment and Plan:   1. COPD with asthma   2. Other allergic rhinitis   3. LPRD (laryngopharyngeal reflux disease)    1.  Treat and prevent inflammation of airway:   A. Ryaltris - 2 sprays each nostril 3-7 times per week  B. Trelegy 200 - 1 inhalation 1 time per day 3-7 times per week  2.  Treat and prevent reflux/LPR:  A. Omeprazole 40 mg - 1 tablet 1-2 times per day  B. Famotidine 40 mg - 1 tablet 1 time per day in evening  C. Decrease caffeine and chocolate consumption  3.  If needed:   A. AirSupra - 2 inhalations every 4-6 hours (Coupon)  B. Cetirizine 10 mg - 1 tablet 1 time per day  4.  Return to clinic in 12 weeks or earlier if problem  Tarron is doing very well on her current plan and  she is doing so well that she tapered down a lot of her medications.  I have encouraged her to use Ryaltris and Trelegy at least 3 times per week in a preventative manner.  She can continue on a proton pump inhibitor and H2 receptor blocker as noted above.  We will see her back in this clinic in 3 months or earlier if there is a problem.  Allena Katz, MD Allergy / Immunology [facility]

## 2022-06-08 NOTE — Patient Instructions (Addendum)
  1.  Treat and prevent inflammation of airway:   A. Ryaltris - 2 sprays each nostril 3-7 times per week  B. Trelegy 200 - 1 inhalation 1 time per day 3-7 times per week  2.  Treat and prevent reflux/LPR:  A. Omeprazole 40 mg - 1 tablet 1-2 times per day  B. Famotidine 40 mg - 1 tablet 1 time per day in evening  C. Decrease caffeine and chocolate consumption  3.  If needed:   A. AirSupra - 2 inhalations every 4-6 hours (Coupon)  B. Cetirizine 10 mg - 1 tablet 1 time per day  4.  Return to clinic in 12 weeks or earlier if problem

## 2022-06-09 ENCOUNTER — Encounter: Payer: Self-pay | Admitting: Allergy and Immunology

## 2022-06-13 ENCOUNTER — Ambulatory Visit: Payer: BC Managed Care – PPO | Admitting: Internal Medicine

## 2022-06-13 ENCOUNTER — Encounter: Payer: Self-pay | Admitting: Internal Medicine

## 2022-06-13 ENCOUNTER — Encounter: Payer: Self-pay | Admitting: Oncology

## 2022-06-13 VITALS — BP 120/78 | HR 78 | Temp 97.6°F | Resp 16 | Ht 65.0 in | Wt 177.2 lb

## 2022-06-13 DIAGNOSIS — E785 Hyperlipidemia, unspecified: Secondary | ICD-10-CM | POA: Diagnosis not present

## 2022-06-13 DIAGNOSIS — R7303 Prediabetes: Secondary | ICD-10-CM | POA: Insufficient documentation

## 2022-06-13 DIAGNOSIS — Z0000 Encounter for general adult medical examination without abnormal findings: Secondary | ICD-10-CM

## 2022-06-13 DIAGNOSIS — I10 Essential (primary) hypertension: Secondary | ICD-10-CM | POA: Diagnosis not present

## 2022-06-13 NOTE — Assessment & Plan Note (Signed)
stable °

## 2022-06-13 NOTE — Progress Notes (Signed)
   Office Visit  Subjective   Patient ID: Rafa Domond   DOB: Jul 11, 1960   Age: 62 y.o.   MRN: [id number]   Chief Complaint No chief complaint on file.    History of Present Illness The patient is a 62 year old Caucasian/White female who presents for a FOLLOW-UP. She see [facility] asthma and allergies.   She has hypertension. The patient has not been checking her blood pressure at home. The patient's current medications include: amlodipine 5 mg tablet and hydrochlorothiazide 12.5 mg oral tablet. The patient has been tolerating her medications well. The patient denies any chest pain, shortness of breath, orthopnea, and PND.  She reports there have been no other symptoms noted.  Labarbara Mehall returns today for routine followup on her cholesterol. Overall, she states she is doing well and is without any complaints or problems at this time. She specifically denies chest pain, abdominal pain, nausea, diarrhea, and myalgias. She remains on dietary management as well as the following cholesterol lowering medications atorvastatin 40 mg oral tablet. She had labs done 6 months ago including a full lipid panel ; these were reviewed at the visit today.      She has borderline diabetes mellitus and her HbA1c was 6.0% in !0/23. She stop taking metformin due to stomach upset.    She also has knee pain and takes meloxicam.  But she is stable from pain point of view.    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   HENT: Negative.    Respiratory: Negative.    Cardiovascular: Negative.   Gastrointestinal: Negative.   Neurological: Negative.        Objective:    Vitals BP 120/78  (BP Location: Left Arm, Patient Position: Sitting, Cuff Size: Normal)   Pulse 78   Temp 97.6 F (36.4 C)   Resp 16   Ht 5' 5"$  (1.651 m)   Wt 177 lb 4 oz (80.4 kg)   SpO2 94%   BMI 29.50 kg/m    Physical Examination Physical Exam Constitutional:      Appearance: Normal appearance.  HENT:     Head: Normocephalic and atraumatic.  Cardiovascular:     Rate and Rhythm: Normal rate and regular rhythm.     Heart sounds: Normal heart sounds.  Pulmonary:     Effort: Pulmonary effort is normal.     Breath sounds: Normal breath sounds.  Abdominal:     General: Bowel sounds are normal.     Palpations: Abdomen is soft.  Neurological:     General: No focal deficit present.     Mental Status: She is alert and oriented to person, place, and time.        Assessment & Plan:   No problem-specific Assessment & Plan notes found for this encounter.    No follow-ups on file.   Garwin Brothers, MD

## 2022-06-13 NOTE — Assessment & Plan Note (Signed)
Will do HbaA1c on next visit.

## 2022-08-30 ENCOUNTER — Encounter: Payer: Self-pay | Admitting: Oncology

## 2022-08-30 ENCOUNTER — Other Ambulatory Visit (HOSPITAL_COMMUNITY): Payer: Self-pay

## 2022-08-30 ENCOUNTER — Telehealth: Payer: Self-pay

## 2022-08-30 NOTE — Telephone Encounter (Signed)
PA request received via pharmacy fax for AIRSUPRA 90-80 MCG/ACT AERO   PA not submitted due to pending question of failed alternatives.

## 2022-08-31 ENCOUNTER — Ambulatory Visit: Payer: BC Managed Care – PPO | Admitting: Allergy and Immunology

## 2022-08-31 DIAGNOSIS — J309 Allergic rhinitis, unspecified: Secondary | ICD-10-CM

## 2022-09-09 ENCOUNTER — Other Ambulatory Visit: Payer: Self-pay

## 2022-09-09 MED ORDER — TRAZODONE HCL 100 MG PO TABS: 100.0000 mg | ORAL_TABLET | Freq: Every day | ORAL | 1 refills | Status: DC

## 2022-09-12 ENCOUNTER — Ambulatory Visit: Payer: BC Managed Care – PPO | Admitting: Internal Medicine

## 2022-09-21 NOTE — Telephone Encounter (Signed)
Albuterol HFA has not helped her symptoms or cough for years.

## 2022-09-22 ENCOUNTER — Other Ambulatory Visit (HOSPITAL_COMMUNITY): Payer: Self-pay

## 2022-09-22 NOTE — Telephone Encounter (Signed)
PA submitted to [facility] via C[facility] and is pending additional questions/determination.    *Albuterol HFA has not helped symptoms of cough

## 2022-09-23 ENCOUNTER — Encounter: Payer: Self-pay | Admitting: Internal Medicine

## 2022-09-23 ENCOUNTER — Ambulatory Visit: Payer: BC Managed Care – PPO | Admitting: Internal Medicine

## 2022-09-23 VITALS — BP 128/78 | HR 76 | Temp 97.9°F | Resp 18 | Ht 65.0 in | Wt 180.2 lb

## 2022-09-23 DIAGNOSIS — I10 Essential (primary) hypertension: Secondary | ICD-10-CM | POA: Diagnosis not present

## 2022-09-23 DIAGNOSIS — E785 Hyperlipidemia, unspecified: Secondary | ICD-10-CM

## 2022-09-23 DIAGNOSIS — R7303 Prediabetes: Secondary | ICD-10-CM | POA: Diagnosis not present

## 2022-09-23 DIAGNOSIS — Z6830 Body mass index (BMI) 30.0-30.9, adult: Secondary | ICD-10-CM

## 2022-09-23 NOTE — Telephone Encounter (Signed)
Key: [id number]  *Still pending at this time.

## 2022-09-23 NOTE — Assessment & Plan Note (Signed)
Her HbA1c was 6.0% 6 months ago,

## 2022-09-23 NOTE — Assessment & Plan Note (Signed)
She has gained 10 since she quit smoking 1 year ago.

## 2022-09-23 NOTE — Addendum Note (Signed)
Addended byRosalie Doctor on: 09/23/2022 10:10 AM   Modules accepted: Orders

## 2022-09-23 NOTE — Assessment & Plan Note (Signed)
She follows with Dr. Melvyn Neth.

## 2022-09-23 NOTE — Assessment & Plan Note (Signed)
She take atorvastatin 40 mg daily and will do lipid panel

## 2022-09-23 NOTE — Assessment & Plan Note (Signed)
Controlled.  

## 2022-09-23 NOTE — Progress Notes (Signed)
Office Visit  Subjective   Patient ID: Lori Freeman   DOB: 1960/11/29   Age: 62 y.o.   MRN: [id number]   Chief Complaint Chief Complaint  Patient presents with   Follow-up    3 month follow up     History of Present Illness The patient is a 62 year old Caucasian/White female who presents for a FOLLOW-UP.   She is c/o pain in her left leg that get heavy when she walk. She is ok when she sit but sometime her legs get heavy when she walk.    She has hypertension. The patient has not been checking her blood pressure at home. The patient's current medications include: amlodipine 5 mg tablet and hydrochlorothiazide 12.5 mg oral tablet. The patient has been tolerating her medications well. The patient denies any chest pain, shortness of breath, orthopnea, and PND.  She reports there have been no other symptoms noted.  Lori Freeman returns today for routine followup on her cholesterol. Overall, she states she is doing well and is without any complaints or problems at this time. She specifically denies chest pain, abdominal pain, nausea, diarrhea, and myalgias. She remains on dietary management as well as the following cholesterol lowering medications atorvastatin 40 mg oral tablet. She had labs done 6 months ago including a full lipid panel ; these were reviewed at the visit today.       She has borderline diabetes mellitus and her HbA1c was 6.0% in !0/23. She stop taking metformin due to stomach upset.     She also has knee pain and takes meloxicam.  But she is stable from pain point of view.    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   HENT: Negative.     Respiratory: Negative.    Cardiovascular: Negative.   Gastrointestinal: Negative.   Musculoskeletal:  Positive for back pain and joint pain.  Neurological: Negative.        Objective:    Vitals BP 128/78 (BP Location: Left Arm, Patient Position: Sitting, Cuff Size: Normal)   Pulse 76   Temp 97.9 F (36.6 C)   Resp 18   Ht 5\' 5"  (1.651 m)   Wt 180 lb 4 oz (81.8 kg)   SpO2 94%   BMI 30.00 kg/m    Physical Examination Physical Exam Constitutional:      Appearance: Normal appearance.  HENT:     Head: Normocephalic and atraumatic.  Eyes:     Extraocular Movements: Extraocular movements intact.     Pupils: Pupils are equal, round, and reactive to light.  Cardiovascular:     Rate and Rhythm: Normal rate and regular rhythm.     Heart sounds: Normal heart sounds.  Pulmonary:     Effort: Pulmonary effort is normal.     Breath sounds: Normal breath sounds.  Abdominal:     General: Bowel sounds are normal.     Palpations: Abdomen is soft.  Neurological:     General: No focal deficit present.     Mental Status: She is alert and oriented to person, place, and time.        Assessment &  Plan:   Essential hypertension Controlled.  Dyslipidemia She take atorvastatin 40 mg daily and will do lipid panel  Borderline diabetes mellitus Her HbA1c was 6.0% 6 months ago,   Hereditary hemochromatosis (HCC) She follows with Dr. Melvyn Neth.  BMI 30.0-30.9,adult She has gained 10 since she quit smoking 1 year ago.    Return in about 3 months (around 12/24/2022).   Lori Northern, MD

## 2022-09-27 NOTE — Telephone Encounter (Signed)
PA has been DENIED, no determination letter received at this time.

## 2022-09-27 NOTE — Telephone Encounter (Signed)
Just an FYI regarding PA outcome.

## 2022-09-28 ENCOUNTER — Other Ambulatory Visit: Payer: Self-pay | Admitting: Allergy and Immunology

## 2022-09-28 ENCOUNTER — Ambulatory Visit: Payer: BC Managed Care – PPO | Admitting: Oncology

## 2022-09-28 ENCOUNTER — Other Ambulatory Visit: Payer: Self-pay | Admitting: *Deleted

## 2022-09-28 ENCOUNTER — Other Ambulatory Visit: Payer: BC Managed Care – PPO

## 2022-09-28 DIAGNOSIS — E785 Hyperlipidemia, unspecified: Secondary | ICD-10-CM | POA: Diagnosis not present

## 2022-09-28 DIAGNOSIS — R7303 Prediabetes: Secondary | ICD-10-CM | POA: Diagnosis not present

## 2022-09-28 MED ORDER — AIRSUPRA 90-80 MCG/ACT IN AERO: 2.0000 | INHALATION_SPRAY | RESPIRATORY_TRACT | 5 refills | Status: AC | PRN

## 2022-09-28 NOTE — Telephone Encounter (Signed)
Pt had airsupra rx transferred to [facility]. I resent rx to [facility] and attached copay card info and they need to run as cash pay.

## 2022-09-29 LAB — LIPID PANEL
Chol/HDL Ratio: 5.3 ratio — ABNORMAL HIGH (ref 0.0–4.4)
Cholesterol, Total: 205 mg/dL — ABNORMAL HIGH (ref 100–199)
HDL: 39 mg/dL — ABNORMAL LOW (ref >39)
LDL Chol Calc (NIH): 136 mg/dL — ABNORMAL HIGH (ref 0–99)
Triglycerides: 168 mg/dL — ABNORMAL HIGH (ref 0–149)
VLDL Cholesterol Cal: 30 mg/dL (ref 5–40)

## 2022-09-29 LAB — CMP14 + ANION GAP
ALT: 32 IU/L (ref 0–32)
AST: 28 IU/L (ref 0–40)
Albumin/Globulin Ratio: 2 (ref 1.2–2.2)
Albumin: 4.5 g/dL (ref 3.9–4.9)
Alkaline Phosphatase: 89 IU/L (ref 44–121)
Anion Gap: 16 mmol/L (ref 10.0–18.0)
BUN/Creatinine Ratio: 25 (ref 12–28)
BUN: 19 mg/dL (ref 8–27)
Bilirubin Total: 0.4 mg/dL (ref 0.0–1.2)
CO2: 24 mmol/L (ref 20–29)
Calcium: 9.6 mg/dL (ref 8.7–10.3)
Chloride: 101 mmol/L (ref 96–106)
Creatinine, Ser: 0.77 mg/dL (ref 0.57–1.00)
Globulin, Total: 2.2 g/dL (ref 1.5–4.5)
Glucose: 130 mg/dL — ABNORMAL HIGH (ref 70–99)
Potassium: 4.5 mmol/L (ref 3.5–5.2)
Sodium: 141 mmol/L (ref 134–144)
Total Protein: 6.7 g/dL (ref 6.0–8.5)
eGFR: 87 mL/min/{1.73_m2} (ref >59)

## 2022-09-29 LAB — HEMOGLOBIN A1C
Est. average glucose Bld gHb Est-mCnc: 131 mg/dL
Hgb A1c MFr Bld: 6.2 % — ABNORMAL HIGH (ref 4.8–5.6)

## 2022-10-06 ENCOUNTER — Inpatient Hospital Stay: Payer: BC Managed Care – PPO

## 2022-10-06 ENCOUNTER — Ambulatory Visit: Payer: BC Managed Care – PPO | Admitting: Oncology

## 2022-10-06 NOTE — Progress Notes (Signed)
Patient called.  Patient aware.  Called patient and left message about results as she requested, and to patient to call office if she has any questions.

## 2022-10-12 NOTE — Progress Notes (Deleted)
[facility]  [address] [place],  [place]  [address] [phone]  Clinic Day:  05/30/2022  Referring physician: Eloisa Northern, MD  HISTORY OF PRESENT ILLNESS:  The patient is a 62 y.o. female with hemochromatosis (C282Y/C282Y).  She occasionally requires maintenance phlebotomies to keep her disease from causing a precipitous rise in her iron parameters.  She comes in today to reassess her iron parameters.  Since her last visit, the patient has been doing fairly well.  With respect to her hemochromatosis, she denies having any systemic symptoms which concern her for disease-related complications.  PHYSICAL EXAM:  There were no vitals taken for this visit. Wt Readings from Last 3 Encounters:  09/23/22 180 lb 4 oz (81.8 kg)  06/13/22 177 lb 4 oz (80.4 kg)  05/30/22 176 lb 6.4 oz (80 kg)   There is no height or weight on file to calculate BMI. Performance status (ECOG): 0 - Asymptomatic Physical Exam Constitutional:      Appearance: Normal appearance. She is not ill-appearing.  HENT:     Mouth/Throat:     Mouth: Mucous membranes are moist.     Pharynx: Oropharynx is clear. No oropharyngeal exudate or posterior oropharyngeal erythema.  Cardiovascular:     Rate and Rhythm: Normal rate and regular rhythm.     Heart sounds: No murmur heard.    No friction rub. No gallop.  Pulmonary:     Effort: Pulmonary effort is normal. No respiratory distress.     Breath sounds: Normal breath sounds. No wheezing, rhonchi or rales.  Abdominal:     General: Bowel sounds are normal. There is no distension.     Palpations: Abdomen is soft. There is no mass.     Tenderness: There is no abdominal tenderness.  Musculoskeletal:        General: No swelling.     Right lower leg: No edema.     Left lower leg: No edema.  Lymphadenopathy:     Cervical: No cervical adenopathy.     Upper Body:     Right upper body: No supraclavicular or axillary adenopathy.     Left upper  body: No supraclavicular or axillary adenopathy.     Lower Body: No right inguinal adenopathy. No left inguinal adenopathy.  Skin:    General: Skin is warm.     Coloration: Skin is not jaundiced.     Findings: No lesion or rash.  Neurological:     General: No focal deficit present.     Mental Status: She is alert and oriented to person, place, and time. Mental status is at baseline.  Psychiatric:        Mood and Affect: Mood normal.        Behavior: Behavior normal.        Thought Content: Thought content normal.     LABS:      Latest Ref Rng & Units 05/30/2022   12:00 AM 07/27/2021   12:00 AM 01/01/2021   12:00 AM  CBC  WBC  9.4     7.0     8.5      Hemoglobin 12.0 - 16.0 13.7     13.9     13.6      Hematocrit 36 - 46 40     43     40      Platelets 150 - 400 K/uL 284     251     257  This result is from an external source.       Latest Ref Rng & Units 09/28/2022   11:34 AM 05/30/2022    2:39 PM 07/27/2021   12:00 AM  CMP  Glucose 70 - 99 mg/dL 213  92    BUN 8 - 27 mg/dL 19  23  20       Creatinine 0.57 - 1.00 mg/dL 0.86  5.78  0.8      Sodium 134 - 144 mmol/L 141  140  139      Potassium 3.5 - 5.2 mmol/L 4.5  3.7  3.9      Chloride 96 - 106 mmol/L 101  106  105      CO2 20 - 29 mmol/L 24  25  25       Calcium 8.7 - 10.3 mg/dL 9.6  9.0  9.1      Total Protein 6.0 - 8.5 g/dL 6.7  7.0    Total Bilirubin 0.0 - 1.2 mg/dL 0.4  0.6    Alkaline Phos 44 - 121 IU/L 89  70  86      AST 0 - 40 IU/L 28  22  31       ALT 0 - 32 IU/L 32  29  31         This result is from an external source.     Latest Reference Range & Units 05/30/22 14:40  Iron 28 - 170 ug/dL 469  UIBC ug/dL 629  TIBC 528 - 413 ug/dL 244  Saturation Ratios 10.4 - 31.8 % 30  Ferritin 11 - 307 ng/mL 39   ASSESSMENT & PLAN:  Assessment/Plan:  A 62 y.o. female with hemochromatosis (C282Y/C282Y).  When evaluating her iron parameters, her ferritin remains below 50.  Based upon this and her other iron parameters  being fine, maintenance phlebotomies are not necessary at this time.  I will see her back in 4 months to reassess her iron parameters. The patient understands all the plans discussed today and is in agreement with them.    Lori Freeman Kirby Funk, MD

## 2022-10-13 ENCOUNTER — Inpatient Hospital Stay: Payer: BC Managed Care – PPO | Admitting: Oncology

## 2022-10-13 ENCOUNTER — Inpatient Hospital Stay: Payer: BC Managed Care – PPO | Attending: Oncology

## 2022-10-13 LAB — CMP (CANCER CENTER ONLY)
ALT: 29 U/L (ref 0–44)
AST: 22 U/L (ref 15–41)
Albumin: 3.8 g/dL (ref 3.5–5.0)
Alkaline Phosphatase: 72 U/L (ref 38–126)
Anion gap: 10 (ref 5–15)
BUN: 24 mg/dL — ABNORMAL HIGH (ref 8–23)
CO2: 24 mmol/L (ref 22–32)
Calcium: 8.8 mg/dL — ABNORMAL LOW (ref 8.9–10.3)
Chloride: 104 mmol/L (ref 98–111)
Creatinine: 0.94 mg/dL (ref 0.44–1.00)
GFR, Estimated: >60 mL/min (ref >60)
Glucose, Bld: 171 mg/dL — ABNORMAL HIGH (ref 70–99)
Potassium: 3.2 mmol/L — ABNORMAL LOW (ref 3.5–5.1)
Sodium: 138 mmol/L (ref 135–145)
Total Bilirubin: 0.3 mg/dL (ref 0.3–1.2)
Total Protein: 6.9 g/dL (ref 6.5–8.1)

## 2022-10-13 LAB — CBC WITH DIFFERENTIAL (CANCER CENTER ONLY)
Abs Immature Granulocytes: 0.05 10*3/uL (ref 0.00–0.07)
Basophils Absolute: 0 10*3/uL (ref 0.0–0.1)
Basophils Relative: 0 %
Eosinophils Absolute: 0.2 10*3/uL (ref 0.0–0.5)
Eosinophils Relative: 2 %
HCT: 40.1 % (ref 36.0–46.0)
Hemoglobin: 13.3 g/dL (ref 12.0–15.0)
Immature Granulocytes: 1 %
Lymphocytes Relative: 26 %
Lymphs Abs: 2 10*3/uL (ref 0.7–4.0)
MCH: 28.6 pg (ref 26.0–34.0)
MCHC: 33.2 g/dL (ref 30.0–36.0)
MCV: 86.2 fL (ref 80.0–100.0)
Monocytes Absolute: 0.5 10*3/uL (ref 0.1–1.0)
Monocytes Relative: 6 %
Neutro Abs: 5 10*3/uL (ref 1.7–7.7)
Neutrophils Relative %: 65 %
Platelet Count: 286 10*3/uL (ref 150–400)
RBC: 4.65 MIL/uL (ref 3.87–5.11)
RDW: 13.1 % (ref 11.5–15.5)
WBC Count: 7.8 10*3/uL (ref 4.0–10.5)
nRBC: 0 % (ref 0.0–0.2)

## 2022-10-13 LAB — IRON AND TIBC
Iron: 97 ug/dL (ref 28–170)
Saturation Ratios: 31 % (ref 10.4–31.8)
TIBC: 311 ug/dL (ref 250–450)
UIBC: 214 ug/dL

## 2022-10-13 LAB — FERRITIN: Ferritin: 31 ng/mL (ref 11–307)

## 2022-10-14 ENCOUNTER — Telehealth: Payer: BC Managed Care – PPO | Admitting: Oncology

## 2022-10-16 NOTE — Progress Notes (Unsigned)
  [facility]  [address] [place],  [place]  [address] [phone]  Clinic Day:  10/17/2022  Referring physician: Eloisa Northern, MD  TELEPHONE VISIT  HISTORY OF PRESENT ILLNESS:  The patient is a 62 y.o. female with hemochromatosis (C282Y/C282Y).  She occasionally requires maintenance phlebotomies to keep her disease from causing a precipitous rise in her iron parameters.  She comes in today to reassess her iron parameters.  Since her last visit, the patient has been doing fairly well.  With respect to her hemochromatosis, she denies having any systemic symptoms which concern her for disease-related complications.  PHYSICAL EXAM: DEFERRED    LABS:      Latest Ref Rng & Units 10/13/2022    1:29 PM 05/30/2022   12:00 AM 07/27/2021   12:00 AM  CBC  WBC 4.0 - 10.5 K/uL 7.8  9.4     7.0      Hemoglobin 12.0 - 15.0 g/dL 57.8  46.9     62.9      Hematocrit 36.0 - 46.0 % 40.1  40     43      Platelets 150 - 400 K/uL 286  284     251         This result is from an external source.       Latest Ref Rng & Units 10/13/2022    1:29 PM 09/28/2022   11:34 AM 05/30/2022    2:39 PM  CMP  Glucose 70 - 99 mg/dL 528  413  92   BUN 8 - 23 mg/dL 24  19  23    Creatinine 0.44 - 1.00 mg/dL 2.44  0.10  2.72   Sodium 135 - 145 mmol/L 138  141  140   Potassium 3.5 - 5.1 mmol/L 3.2  4.5  3.7   Chloride 98 - 111 mmol/L 104  101  106   CO2 22 - 32 mmol/L 24  24  25    Calcium 8.9 - 10.3 mg/dL 8.8  9.6  9.0   Total Protein 6.5 - 8.1 g/dL 6.9  6.7  7.0   Total Bilirubin 0.3 - 1.2 mg/dL 0.3  0.4  0.6   Alkaline Phos 38 - 126 U/L 72  89  70   AST 15 - 41 U/L 22  28  22    ALT 0 - 44 U/L 29  32  29     Latest Reference Range & Units 10/13/22 13:29  Iron 28 - 170 ug/dL 97  UIBC ug/dL 536  TIBC 644 - 034 ug/dL 742  Saturation Ratios 10.4 - 31.8 % 31  Ferritin 11 - 307 ng/mL 31   ASSESSMENT & PLAN:  Assessment/Plan:  A 62 y.o. female with hemochromatosis (C282Y/C282Y).   When evaluating her iron parameters, her ferritin remains well below 50.  Based upon this and her other iron parameters being fine, maintenance phlebotomies are not necessary at this time.  I will see her back in 4 months to reassess her iron parameters. The patient understands all the plans discussed today and is in agreement with them.    Weston Kallman Kirby Funk, MD

## 2022-10-17 ENCOUNTER — Other Ambulatory Visit: Payer: Self-pay | Admitting: Oncology

## 2022-10-17 ENCOUNTER — Inpatient Hospital Stay (INDEPENDENT_AMBULATORY_CARE_PROVIDER_SITE_OTHER): Payer: BC Managed Care – PPO | Admitting: Oncology

## 2022-10-24 ENCOUNTER — Other Ambulatory Visit: Payer: Self-pay

## 2022-10-24 ENCOUNTER — Other Ambulatory Visit: Payer: Self-pay | Admitting: Internal Medicine

## 2022-11-02 ENCOUNTER — Other Ambulatory Visit: Payer: Self-pay | Admitting: Internal Medicine

## 2022-11-25 DIAGNOSIS — Z23 Encounter for immunization: Secondary | ICD-10-CM | POA: Diagnosis not present

## 2022-11-25 DIAGNOSIS — S61431A Puncture wound without foreign body of right hand, initial encounter: Secondary | ICD-10-CM | POA: Diagnosis not present

## 2022-11-25 DIAGNOSIS — L03113 Cellulitis of right upper limb: Secondary | ICD-10-CM | POA: Diagnosis not present

## 2022-12-23 ENCOUNTER — Encounter: Payer: Self-pay | Admitting: Internal Medicine

## 2022-12-23 ENCOUNTER — Ambulatory Visit: Payer: BC Managed Care – PPO | Admitting: Internal Medicine

## 2022-12-23 VITALS — BP 130/78 | HR 86 | Temp 98.0°F | Resp 18 | Ht 65.0 in | Wt 179.5 lb

## 2022-12-23 DIAGNOSIS — I10 Essential (primary) hypertension: Secondary | ICD-10-CM

## 2022-12-23 DIAGNOSIS — F419 Anxiety disorder, unspecified: Secondary | ICD-10-CM | POA: Diagnosis not present

## 2022-12-23 DIAGNOSIS — R7303 Prediabetes: Secondary | ICD-10-CM

## 2022-12-23 DIAGNOSIS — E785 Hyperlipidemia, unspecified: Secondary | ICD-10-CM | POA: Diagnosis not present

## 2022-12-23 MED ORDER — ESCITALOPRAM OXALATE 10 MG PO TABS: 10.0000 mg | ORAL_TABLET | Freq: Every day | ORAL | 3 refills | Status: DC

## 2022-12-23 NOTE — Assessment & Plan Note (Signed)
Controlled.  

## 2022-12-23 NOTE — Assessment & Plan Note (Signed)
She take atorvastatin 40 mg daily

## 2022-12-23 NOTE — Assessment & Plan Note (Signed)
She could not tolerate metformin and will cut down carbohydrate.

## 2022-12-23 NOTE — Progress Notes (Signed)
   Office Visit  Subjective   Patient ID: Lori Freeman   DOB: 09/12/1960   Age: 62 y.o.   MRN: [id number]   Chief Complaint Chief Complaint  Patient presents with   Follow-up    Essential hypertension     History of Present Illness  The patient is a 62 year old Caucasian/White female who presents for a FOLLOW-UP.  She is asking for something for her nerves. She has hypertension. The patient has not been checking her blood pressure at home. The patient's current medications include: amlodipine 5 mg tablet and hydrochlorothiazide 12.5 mg oral tablet. The patient has been tolerating her medications well. The patient denies any chest pain, shortness of breath, orthopnea, and PND.  She reports there have been no other symptoms noted.  Lori Freeman returns today for routine followup on her cholesterol. Overall, she states she is doing well and is without any complaints or problems at this time. She specifically denies chest pain, abdominal pain, nausea, diarrhea, and myalgias. She remains on dietary management as well as the following cholesterol lowering medications atorvastatin 40 mg oral tablet. She had labs done 10/02/22 that was reviewed.       She has borderline diabetes mellitus and her HbA1c was 6.2% on 09/28/22. She stop taking metformin due to stomach upset.        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   HENT: Negative.    Respiratory: Negative.    Cardiovascular: Negative.   Gastrointestinal: Negative.   Neurological: Negative.   Psychiatric/Behavioral:  The patient is nervous/anxious.        Objective:    Vitals BP 130/78 (BP Location: Left Arm,  Patient Position: Sitting, Cuff Size: Normal)   Pulse 86   Temp 98 F (36.7 C)   Resp 18   Ht 5\' 5"  (1.651 m)   Wt 179 lb 8 oz (81.4 kg)   SpO2 92%   BMI 29.87 kg/m    Physical Examination Physical Exam Constitutional:      Appearance: Normal appearance.  HENT:     Head: Normocephalic and atraumatic.  Cardiovascular:     Rate and Rhythm: Normal rate and regular rhythm.     Heart sounds: Normal heart sounds.  Pulmonary:     Effort: Pulmonary effort is normal.     Breath sounds: Normal breath sounds.  Abdominal:     General: Bowel sounds are normal.     Palpations: Abdomen is soft.  Neurological:     General: No focal deficit present.     Mental Status: She is alert and oriented to person, place, and time.        Assessment & Plan:   Anxiety I will start her on escitalopram 10 mg daily.  Borderline diabetes mellitus She could not tolerate metformin and will cut down carbohydrate.   Essential hypertension Controlled.  Dyslipidemia She take atorvastatin 40 mg daily    Return in about 3 months (around 03/25/2023).   Eloisa Northern, MD

## 2022-12-23 NOTE — Assessment & Plan Note (Signed)
I will start her on escitalopram 10 mg daily.

## 2023-01-17 DIAGNOSIS — Z23 Encounter for immunization: Secondary | ICD-10-CM | POA: Diagnosis not present

## 2023-01-18 ENCOUNTER — Other Ambulatory Visit: Payer: Self-pay | Admitting: Internal Medicine

## 2023-01-27 ENCOUNTER — Other Ambulatory Visit: Payer: Self-pay | Admitting: Internal Medicine

## 2023-02-14 ENCOUNTER — Other Ambulatory Visit: Payer: BC Managed Care – PPO

## 2023-02-15 ENCOUNTER — Other Ambulatory Visit: Payer: BC Managed Care – PPO

## 2023-02-16 ENCOUNTER — Inpatient Hospital Stay: Payer: BC Managed Care – PPO | Attending: Oncology

## 2023-02-16 LAB — IRON AND TIBC
Iron: 138 ug/dL (ref 28–170)
Saturation Ratios: 39 % — ABNORMAL HIGH (ref 10.4–31.8)
TIBC: 356 ug/dL (ref 250–450)
UIBC: 218 ug/dL

## 2023-02-16 LAB — CBC WITH DIFFERENTIAL (CANCER CENTER ONLY)
Abs Immature Granulocytes: 0.03 10*3/uL (ref 0.00–0.07)
Basophils Absolute: 0.1 10*3/uL (ref 0.0–0.1)
Basophils Relative: 1 %
Eosinophils Absolute: 0.2 10*3/uL (ref 0.0–0.5)
Eosinophils Relative: 3 %
HCT: 43.3 % (ref 36.0–46.0)
Hemoglobin: 14.1 g/dL (ref 12.0–15.0)
Immature Granulocytes: 1 %
Lymphocytes Relative: 28 %
Lymphs Abs: 1.8 10*3/uL (ref 0.7–4.0)
MCH: 28.2 pg (ref 26.0–34.0)
MCHC: 32.6 g/dL (ref 30.0–36.0)
MCV: 86.6 fL (ref 80.0–100.0)
Monocytes Absolute: 0.5 10*3/uL (ref 0.1–1.0)
Monocytes Relative: 8 %
Neutro Abs: 3.9 10*3/uL (ref 1.7–7.7)
Neutrophils Relative %: 59 %
Platelet Count: 206 10*3/uL (ref 150–400)
RBC: 5 MIL/uL (ref 3.87–5.11)
RDW: 13.1 % (ref 11.5–15.5)
WBC Count: 6.6 10*3/uL (ref 4.0–10.5)
nRBC: 0 % (ref 0.0–0.2)

## 2023-02-16 LAB — CMP (CANCER CENTER ONLY)
ALT: 27 U/L (ref 0–44)
AST: 22 U/L (ref 15–41)
Albumin: 4.5 g/dL (ref 3.5–5.0)
Alkaline Phosphatase: 89 U/L (ref 38–126)
Anion gap: 12 (ref 5–15)
BUN: 17 mg/dL (ref 8–23)
CO2: 22 mmol/L (ref 22–32)
Calcium: 9.3 mg/dL (ref 8.9–10.3)
Chloride: 105 mmol/L (ref 98–111)
Creatinine: 0.85 mg/dL (ref 0.44–1.00)
GFR, Estimated: >60 mL/min (ref >60)
Glucose, Bld: 107 mg/dL — ABNORMAL HIGH (ref 70–99)
Potassium: 4 mmol/L (ref 3.5–5.1)
Sodium: 139 mmol/L (ref 135–145)
Total Bilirubin: 0.6 mg/dL (ref 0.3–1.2)
Total Protein: 7.6 g/dL (ref 6.5–8.1)

## 2023-02-16 LAB — FERRITIN: Ferritin: 44 ng/mL (ref 11–307)

## 2023-02-19 NOTE — Progress Notes (Signed)
  [facility]  [address] [place],  [place]  [address] [phone]  Clinic Day:  02/20/2023  Referring physician: Eloisa Northern, MD  TELEPHONE VISIT  HISTORY OF PRESENT ILLNESS:  The patient is a 62 y.o. female with hemochromatosis (C282Y/C282Y).  She occasionally requires maintenance phlebotomies to keep her disease from causing a precipitous rise in her iron parameters.  This phone visit was made to go over her iron parameters.  Since her last visit, the patient has been doing fairly well.  With respect to her hemochromatosis, she denies having any systemic symptoms which concern her for disease-related complications.  PHYSICAL EXAM: DEFERRED    LABS:      Latest Ref Rng & Units 02/16/2023   10:39 AM 10/13/2022    1:29 PM 05/30/2022   12:00 AM  CBC  WBC 4.0 - 10.5 K/uL 6.6  7.8  9.4      Hemoglobin 12.0 - 15.0 g/dL 88.4  16.6  06.3      Hematocrit 36.0 - 46.0 % 43.3  40.1  40      Platelets 150 - 400 K/uL 206  286  284         This result is from an external source.      Latest Ref Rng & Units 02/16/2023   10:39 AM 10/13/2022    1:29 PM 09/28/2022   11:34 AM  CMP  Glucose 70 - 99 mg/dL 016  010  932   BUN 8 - 23 mg/dL 17  24  19    Creatinine 0.44 - 1.00 mg/dL 3.55  7.32  2.02   Sodium 135 - 145 mmol/L 139  138  141   Potassium 3.5 - 5.1 mmol/L 4.0  3.2  4.5   Chloride 98 - 111 mmol/L 105  104  101   CO2 22 - 32 mmol/L 22  24  24    Calcium 8.9 - 10.3 mg/dL 9.3  8.8  9.6   Total Protein 6.5 - 8.1 g/dL 7.6  6.9  6.7   Total Bilirubin 0.3 - 1.2 mg/dL 0.6  0.3  0.4   Alkaline Phos 38 - 126 U/L 89  72  89   AST 15 - 41 U/L 22  22  28    ALT 0 - 44 U/L 27  29  32     Latest Reference Range & Units 02/16/23 10:39  Iron 28 - 170 ug/dL 542  UIBC ug/dL 706  TIBC 237 - 628 ug/dL 315  Saturation Ratios 10.4 - 31.8 % 39 (H)  Ferritin 11 - 307 ng/mL 44  (H): Data is abnormally high ASSESSMENT & PLAN:  Assessment/Plan:  A 62 y.o. female with  hemochromatosis (C282Y/C282Y).  When evaluating her iron parameters, her ferritin remains below 50.  However, as her other iron parameters are higher than what I would like for them to be, I will arrange for her to be phlebotomized today and in 3 months.  I will see her back in 6 months to reassess her iron parameters. The patient understands all the plans discussed today and is in agreement with them.    Maxie Debose Kirby Funk, MD

## 2023-02-20 ENCOUNTER — Telehealth: Payer: Self-pay | Admitting: Oncology

## 2023-02-20 ENCOUNTER — Inpatient Hospital Stay: Payer: BC Managed Care – PPO

## 2023-02-20 ENCOUNTER — Inpatient Hospital Stay: Payer: BC Managed Care – PPO | Admitting: Oncology

## 2023-02-20 ENCOUNTER — Encounter: Payer: Self-pay | Admitting: Oncology

## 2023-02-20 NOTE — Progress Notes (Signed)
Lori Freeman presents today for phlebotomy per MD orders. Phlebotomy procedure started at 1355 and ended at 1405. 490 grams removed. Patient observed for 10 minutes after procedure without any incident. Patient tolerated procedure well. IV needle removed intact. RIGHT AC

## 2023-02-20 NOTE — Telephone Encounter (Signed)
02/20/23 LVM next appt scheduled for phlebotomy.

## 2023-02-20 NOTE — Patient Instructions (Signed)

## 2023-02-27 ENCOUNTER — Other Ambulatory Visit: Payer: Self-pay | Admitting: Oncology

## 2023-02-27 ENCOUNTER — Encounter: Payer: Self-pay | Admitting: Oncology

## 2023-03-05 ENCOUNTER — Other Ambulatory Visit: Payer: Self-pay | Admitting: Internal Medicine

## 2023-03-07 ENCOUNTER — Other Ambulatory Visit: Payer: Self-pay

## 2023-03-07 MED ORDER — TRAZODONE HCL 100 MG PO TABS: 100.0000 mg | ORAL_TABLET | Freq: Every day | ORAL | 1 refills | Status: DC

## 2023-03-07 NOTE — Progress Notes (Signed)
Rx Refill

## 2023-03-21 ENCOUNTER — Other Ambulatory Visit: Payer: Self-pay

## 2023-03-31 ENCOUNTER — Other Ambulatory Visit: Payer: Self-pay

## 2023-03-31 MED ORDER — HYDROCHLOROTHIAZIDE 12.5 MG PO TABS: 12.5000 mg | ORAL_TABLET | Freq: Every day | ORAL | 0 refills | Status: DC

## 2023-03-31 MED ORDER — AMLODIPINE BESYLATE 5 MG PO TABS: 5.0000 mg | ORAL_TABLET | Freq: Every day | ORAL | 0 refills | Status: DC

## 2023-03-31 NOTE — Progress Notes (Signed)
Rx Refill

## 2023-04-03 ENCOUNTER — Encounter: Payer: BC Managed Care – PPO | Admitting: Internal Medicine

## 2023-04-04 DIAGNOSIS — B9689 Other specified bacterial agents as the cause of diseases classified elsewhere: Secondary | ICD-10-CM | POA: Diagnosis not present

## 2023-04-04 DIAGNOSIS — R519 Headache, unspecified: Secondary | ICD-10-CM | POA: Diagnosis not present

## 2023-04-04 DIAGNOSIS — J019 Acute sinusitis, unspecified: Secondary | ICD-10-CM | POA: Diagnosis not present

## 2023-04-04 DIAGNOSIS — J3489 Other specified disorders of nose and nasal sinuses: Secondary | ICD-10-CM | POA: Diagnosis not present

## 2023-04-24 ENCOUNTER — Ambulatory Visit: Payer: BC Managed Care – PPO | Admitting: Internal Medicine

## 2023-04-24 ENCOUNTER — Encounter: Payer: Self-pay | Admitting: Internal Medicine

## 2023-04-24 VITALS — BP 122/78 | HR 73 | Temp 97.7°F | Resp 18 | Ht 65.0 in | Wt 177.4 lb

## 2023-04-24 DIAGNOSIS — E785 Hyperlipidemia, unspecified: Secondary | ICD-10-CM | POA: Diagnosis not present

## 2023-04-24 DIAGNOSIS — H538 Other visual disturbances: Secondary | ICD-10-CM

## 2023-04-24 DIAGNOSIS — F419 Anxiety disorder, unspecified: Secondary | ICD-10-CM | POA: Diagnosis not present

## 2023-04-24 DIAGNOSIS — I10 Essential (primary) hypertension: Secondary | ICD-10-CM

## 2023-04-24 MED ORDER — ESCITALOPRAM OXALATE 10 MG PO TABS: 10.0000 mg | ORAL_TABLET | Freq: Every day | ORAL | 2 refills | Status: AC

## 2023-04-25 DIAGNOSIS — H538 Other visual disturbances: Secondary | ICD-10-CM | POA: Insufficient documentation

## 2023-04-25 DIAGNOSIS — B0053 Herpesviral conjunctivitis: Secondary | ICD-10-CM | POA: Diagnosis not present

## 2023-04-25 NOTE — Assessment & Plan Note (Signed)
Blood pressure is controlled

## 2023-04-25 NOTE — Assessment & Plan Note (Signed)
Will see optometrist and has an appointment tomorrow.

## 2023-04-25 NOTE — Assessment & Plan Note (Signed)
I will continue with escitalopram as that is helping her.

## 2023-04-25 NOTE — Assessment & Plan Note (Signed)
She will follow-up with Dr. Melvyn Neth.

## 2023-04-25 NOTE — Assessment & Plan Note (Signed)
I will continue with atorvastatin 40 mg daily.  She does not have any side effect from this medicine.

## 2023-04-27 DIAGNOSIS — B0052 Herpesviral keratitis: Secondary | ICD-10-CM | POA: Diagnosis not present

## 2023-04-28 ENCOUNTER — Other Ambulatory Visit: Payer: Self-pay | Admitting: Internal Medicine

## 2023-05-03 DIAGNOSIS — B0052 Herpesviral keratitis: Secondary | ICD-10-CM | POA: Diagnosis not present

## 2023-05-22 DIAGNOSIS — B0052 Herpesviral keratitis: Secondary | ICD-10-CM | POA: Diagnosis not present

## 2023-05-23 ENCOUNTER — Inpatient Hospital Stay: Payer: BC Managed Care – PPO | Attending: Internal Medicine

## 2023-05-24 ENCOUNTER — Telehealth: Payer: Self-pay | Admitting: Oncology

## 2023-05-24 NOTE — Telephone Encounter (Signed)
Contacted pt to schedule appts and to R/S missed Phlebotomy appt. Unable to reach via phone, voicemail was left.     Follow-up disposition: Return in about 6 months (around 08/21/2023).  Check out comments: Phlebotomy 05-23-23 and 08-21-23 Labs 08-18-23

## 2023-05-25 NOTE — Telephone Encounter (Signed)
Patient has been scheduled. Aware of appt dates and times.

## 2023-05-29 ENCOUNTER — Inpatient Hospital Stay: Payer: BC Managed Care – PPO | Attending: Oncology

## 2023-05-29 NOTE — Progress Notes (Signed)
Lori Freeman presents today for phlebotomy per MD orders. Phlebotomy procedure started at 1440 and ended at 1450. 485 grams removed. Patient observed for 5 minutes after procedure without any incident. Patient tolerated procedure well. IV needle removed intact. RIGHT AC

## 2023-07-05 ENCOUNTER — Ambulatory Visit: Admitting: Internal Medicine

## 2023-07-05 VITALS — BP 130/82 | HR 80 | Temp 97.3°F | Resp 18 | Ht 65.0 in | Wt 177.0 lb

## 2023-07-05 DIAGNOSIS — J302 Other seasonal allergic rhinitis: Secondary | ICD-10-CM

## 2023-07-05 DIAGNOSIS — J069 Acute upper respiratory infection, unspecified: Secondary | ICD-10-CM | POA: Diagnosis not present

## 2023-07-05 DIAGNOSIS — R0989 Other specified symptoms and signs involving the circulatory and respiratory systems: Secondary | ICD-10-CM | POA: Insufficient documentation

## 2023-07-05 LAB — POCT INFLUENZA A/B
Influenza A, POC: NEGATIVE
Influenza B, POC: NEGATIVE

## 2023-07-05 LAB — POC COVID19 BINAXNOW: SARS Coronavirus 2 Ag: NEGATIVE

## 2023-07-05 MED ORDER — FLUTICASONE PROPIONATE 50 MCG/ACT NA SUSP: 1.0000 | Freq: Every day | NASAL | 2 refills | Status: DC

## 2023-07-05 NOTE — Assessment & Plan Note (Signed)
 I will give her mucinex 600 mg twice a day

## 2023-07-05 NOTE — Assessment & Plan Note (Signed)
 Her COVID and flu  test are negative

## 2023-07-05 NOTE — Progress Notes (Signed)
   Acute Office Visit  Subjective:     Patient ID: Lori Freeman, female    DOB: [birth year], 63 y.o.   MRN: [id number]  Chief Complaint  Patient presents with   Office visit    Weak, body aches, congestion    HPI Patient is in today for 6 days ago with fever, chills, body ache. Her son in law has influenza and was admitted to hospital. Her temperature was 102 degree high. She has no fever for last 2 day. She just feel weak and tired. Her sinus are congestion. She has cough with flame, it is thick but come out easily.   Review of Systems  Constitutional:  Positive for malaise/fatigue.  HENT:  Positive for congestion.   Respiratory:  Positive for cough.   Cardiovascular: Negative.   Neurological: Negative.         Objective:    BP 130/82 (BP Location: Left Arm, Patient Position: Sitting, Cuff Size: Normal)   Pulse 80   Temp (!) 97.3 F (36.3 C)   Resp 18   Ht 5\' 5"  (1.651 m)   Wt 177 lb (80.3 kg)   SpO2 95%   BMI 29.45 kg/m    Physical Exam Constitutional:      Appearance: Normal appearance.  HENT:     Head: Normocephalic and atraumatic.  Cardiovascular:     Rate and Rhythm: Normal rate and regular rhythm.     Heart sounds: Normal heart sounds.  Pulmonary:     Effort: Pulmonary effort is normal.     Breath sounds: Normal breath sounds.  Neurological:     General: No focal deficit present.     Mental Status: She is alert and oriented to person, place, and time.     Results for orders placed or performed in visit on 07/05/23  POC COVID-19 BinaxNow  Result Value Ref Range   SARS Coronavirus 2 Ag Negative Negative  POCT Influenza A/B  Result Value Ref Range   Influenza A, POC Negative Negative   Influenza B, POC Negative Negative        Assessment & Plan:   Problem List Items Addressed This Visit       Respiratory   Chest congestion - Primary   I will give her mucinex 600 mg twice a day      Relevant Orders   POC COVID-19 BinaxNow  (Completed)   POCT Influenza A/B (Completed)   Upper respiratory tract infection   Her COVID and flu  test are negative        Other   Seasonal allergies   She is taking claritin and I will send flonase        No orders of the defined types were placed in this encounter.   No follow-ups on file.  Eloisa Northern, MD

## 2023-07-05 NOTE — Assessment & Plan Note (Signed)
 She is taking claritin and I will send flonase

## 2023-07-14 ENCOUNTER — Other Ambulatory Visit: Payer: Self-pay

## 2023-07-24 ENCOUNTER — Ambulatory Visit: Payer: BC Managed Care – PPO | Admitting: Internal Medicine

## 2023-08-05 ENCOUNTER — Other Ambulatory Visit: Payer: Self-pay | Admitting: Internal Medicine

## 2023-08-16 NOTE — Progress Notes (Unsigned)
  [facility]  [address] [place],  [place]  [address] [phone]  Clinic Day:  02/20/2023  Referring physician: Eloisa Northern, MD  TELEPHONE VISIT  HISTORY OF PRESENT ILLNESS:  The patient is a 63 y.o. female with hemochromatosis (C282Y/C282Y).  She occasionally requires maintenance phlebotomies to keep her disease from causing a precipitous rise in her iron parameters.  This phone visit was made to go over her iron parameters.  Since her last visit, the patient has been doing fairly well.  With respect to her hemochromatosis, she denies having any systemic symptoms which concern her for disease-related complications.  PHYSICAL EXAM: DEFERRED    LABS:      Latest Ref Rng & Units 02/16/2023   10:39 AM 10/13/2022    1:29 PM 05/30/2022   12:00 AM  CBC  WBC 4.0 - 10.5 K/uL 6.6  7.8  9.4      Hemoglobin 12.0 - 15.0 g/dL 88.4  16.6  06.3      Hematocrit 36.0 - 46.0 % 43.3  40.1  40      Platelets 150 - 400 K/uL 206  286  284         This result is from an external source.      Latest Ref Rng & Units 02/16/2023   10:39 AM 10/13/2022    1:29 PM 09/28/2022   11:34 AM  CMP  Glucose 70 - 99 mg/dL 016  010  932   BUN 8 - 23 mg/dL 17  24  19    Creatinine 0.44 - 1.00 mg/dL 3.55  7.32  2.02   Sodium 135 - 145 mmol/L 139  138  141   Potassium 3.5 - 5.1 mmol/L 4.0  3.2  4.5   Chloride 98 - 111 mmol/L 105  104  101   CO2 22 - 32 mmol/L 22  24  24    Calcium 8.9 - 10.3 mg/dL 9.3  8.8  9.6   Total Protein 6.5 - 8.1 g/dL 7.6  6.9  6.7   Total Bilirubin 0.3 - 1.2 mg/dL 0.6  0.3  0.4   Alkaline Phos 38 - 126 U/L 89  72  89   AST 15 - 41 U/L 22  22  28    ALT 0 - 44 U/L 27  29  32     Latest Reference Range & Units 02/16/23 10:39  Iron 28 - 170 ug/dL 542  UIBC ug/dL 706  TIBC 237 - 628 ug/dL 315  Saturation Ratios 10.4 - 31.8 % 39 (H)  Ferritin 11 - 307 ng/mL 44  (H): Data is abnormally high ASSESSMENT & PLAN:  Assessment/Plan:  A 63 y.o. female with  hemochromatosis (C282Y/C282Y).  When evaluating her iron parameters, her ferritin remains below 50.  However, as her other iron parameters are higher than what I would like for them to be, I will arrange for her to be phlebotomized today and in 3 months.  I will see her back in 6 months to reassess her iron parameters. The patient understands all the plans discussed today and is in agreement with them.    Maxie Debose Kirby Funk, MD

## 2023-08-17 ENCOUNTER — Inpatient Hospital Stay: Payer: BC Managed Care – PPO

## 2023-08-17 ENCOUNTER — Telehealth: Payer: Self-pay

## 2023-08-17 ENCOUNTER — Other Ambulatory Visit: Payer: Self-pay | Admitting: Oncology

## 2023-08-17 ENCOUNTER — Inpatient Hospital Stay: Payer: BC Managed Care – PPO | Attending: Oncology | Admitting: Oncology

## 2023-08-17 LAB — IRON AND TIBC
Iron: 57 ug/dL (ref 28–170)
Saturation Ratios: 17 % (ref 10.4–31.8)
TIBC: 346 ug/dL (ref 250–450)
UIBC: 289 ug/dL

## 2023-08-17 LAB — CMP (CANCER CENTER ONLY)
ALT: 17 U/L (ref 0–44)
AST: 21 U/L (ref 15–41)
Albumin: 4.1 g/dL (ref 3.5–5.0)
Alkaline Phosphatase: 96 U/L (ref 38–126)
Anion gap: 11 (ref 5–15)
BUN: 17 mg/dL (ref 8–23)
CO2: 25 mmol/L (ref 22–32)
Calcium: 9.1 mg/dL (ref 8.9–10.3)
Chloride: 104 mmol/L (ref 98–111)
Creatinine: 0.78 mg/dL (ref 0.44–1.00)
GFR, Estimated: >60 mL/min (ref >60)
Glucose, Bld: 121 mg/dL — ABNORMAL HIGH (ref 70–99)
Potassium: 4.1 mmol/L (ref 3.5–5.1)
Sodium: 140 mmol/L (ref 135–145)
Total Bilirubin: 0.2 mg/dL (ref 0.0–1.2)
Total Protein: 6.8 g/dL (ref 6.5–8.1)

## 2023-08-17 LAB — CBC WITH DIFFERENTIAL (CANCER CENTER ONLY)
Abs Immature Granulocytes: 0.02 10*3/uL (ref 0.00–0.07)
Basophils Absolute: 0 10*3/uL (ref 0.0–0.1)
Basophils Relative: 1 %
Eosinophils Absolute: 0.3 10*3/uL (ref 0.0–0.5)
Eosinophils Relative: 5 %
HCT: 39.8 % (ref 36.0–46.0)
Hemoglobin: 13.3 g/dL (ref 12.0–15.0)
Immature Granulocytes: 0 %
Lymphocytes Relative: 29 %
Lymphs Abs: 1.7 10*3/uL (ref 0.7–4.0)
MCH: 27.3 pg (ref 26.0–34.0)
MCHC: 33.4 g/dL (ref 30.0–36.0)
MCV: 81.7 fL (ref 80.0–100.0)
Monocytes Absolute: 0.5 10*3/uL (ref 0.1–1.0)
Monocytes Relative: 8 %
Neutro Abs: 3.3 10*3/uL (ref 1.7–7.7)
Neutrophils Relative %: 57 %
Platelet Count: 279 10*3/uL (ref 150–400)
RBC: 4.87 MIL/uL (ref 3.87–5.11)
RDW: 13.5 % (ref 11.5–15.5)
WBC Count: 5.8 10*3/uL (ref 4.0–10.5)
nRBC: 0 % (ref 0.0–0.2)
nRBC: 0 /100{WBCs}

## 2023-08-17 LAB — FERRITIN: Ferritin: 16 ng/mL (ref 11–307)

## 2023-08-17 NOTE — Telephone Encounter (Signed)
 Pt called to get her lab results and see if she needs to have any phlebotomies scheduled. Dr Harles Lied reviewed her labs, her ferritin is 16. He said she does NOT need phlebotomies. He wants to see her in 6 months.

## 2023-08-18 ENCOUNTER — Encounter: Payer: Self-pay | Admitting: Oncology

## 2023-08-18 NOTE — Progress Notes (Signed)
 [facility]  [address] [place],  [place]  [address] [phone]  Clinic Day:  08/17/2023  Referring physician: Tita Form, MD   HISTORY OF PRESENT ILLNESS:  The patient is a 63 y.o. female with hemochromatosis (C282Y/C282Y).  She occasionally requires maintenance phlebotomies to keep her disease from causing a precipitous rise in her iron parameters.  She comes in today for routine follow-up.  Since her last visit, the patient has been doing fairly well.  With respect to her hemochromatosis, she denies having any systemic symptoms which concern her for disease-related complications.   PHYSICAL EXAM:  There were no vitals taken for this visit. Wt Readings from Last 3 Encounters:  07/05/23 177 lb (80.3 kg)  04/24/23 177 lb 6 oz (80.5 kg)  12/23/22 179 lb 8 oz (81.4 kg)   There is no height or weight on file to calculate BMI. Performance status (ECOG): 0 - Asymptomatic Physical Exam Constitutional:      Appearance: Normal appearance. She is not ill-appearing.  HENT:     Mouth/Throat:     Mouth: Mucous membranes are moist.     Pharynx: Oropharynx is clear. No oropharyngeal exudate or posterior oropharyngeal erythema.  Cardiovascular:     Rate and Rhythm: Normal rate and regular rhythm.     Heart sounds: No murmur heard.    No friction rub. No gallop.  Pulmonary:     Effort: Pulmonary effort is normal. No respiratory distress.     Breath sounds: Normal breath sounds. No wheezing, rhonchi or rales.  Abdominal:     General: Bowel sounds are normal. There is no distension.     Palpations: Abdomen is soft. There is no mass.     Tenderness: There is no abdominal tenderness.  Musculoskeletal:        General: No swelling.     Right lower leg: No edema.     Left lower leg: No edema.  Lymphadenopathy:     Cervical: No cervical adenopathy.     Upper Body:     Right upper body: No supraclavicular or axillary adenopathy.     Left upper body: No  supraclavicular or axillary adenopathy.     Lower Body: No right inguinal adenopathy. No left inguinal adenopathy.  Skin:    General: Skin is warm.     Coloration: Skin is not jaundiced.     Findings: No lesion or rash.  Neurological:     General: No focal deficit present.     Mental Status: She is alert and oriented to person, place, and time. Mental status is at baseline.  Psychiatric:        Mood and Affect: Mood normal.        Behavior: Behavior normal.        Thought Content: Thought content normal.     LABS:      Latest Ref Rng & Units 08/17/2023   10:07 AM 02/16/2023   10:39 AM 10/13/2022    1:29 PM  CBC  WBC 4.0 - 10.5 K/uL 5.8  6.6  7.8   Hemoglobin 12.0 - 15.0 g/dL 91.4  78.2  95.6   Hematocrit 36.0 - 46.0 % 39.8  43.3  40.1   Platelets 150 - 400 K/uL 279  206  286       Latest Ref Rng & Units 08/17/2023   10:07 AM 02/16/2023   10:39 AM 10/13/2022    1:29 PM  CMP  Glucose 70 - 99 mg/dL 213  107  171   BUN 8 - 23 mg/dL 17  17  24    Creatinine 0.44 - 1.00 mg/dL 2.95  1.88  4.16   Sodium 135 - 145 mmol/L 140  139  138   Potassium 3.5 - 5.1 mmol/L 4.1  4.0  3.2   Chloride 98 - 111 mmol/L 104  105  104   CO2 22 - 32 mmol/L 25  22  24    Calcium  8.9 - 10.3 mg/dL 9.1  9.3  8.8   Total Protein 6.5 - 8.1 g/dL 6.8  7.6  6.9   Total Bilirubin 0.0 - 1.2 mg/dL 0.2  0.6  0.3   Alkaline Phos 38 - 126 U/L 96  89  72   AST 15 - 41 U/L 21  22  22    ALT 0 - 44 U/L 17  27  29      Latest Reference Range & Units 08/17/23 10:07  Iron 28 - 170 ug/dL 57  UIBC ug/dL 606  TIBC 301 - 601 ug/dL 093  Saturation Ratios 10.4 - 31.8 % 17  Ferritin 11 - 307 ng/mL 16   ASSESSMENT & PLAN:  Assessment/Plan:  A 63 y.o. female with hemochromatosis (C282Y/C282Y).  When evaluating her labs today, iron parameters remain ideal, particularly as her ferritin is below 50.  Based upon this, maintenance phlebotomies are not needed at this time.  I will see her back in 6 months to reassess her iron  parameters to determine if maintenance phlebotomies will be necessary then. The patient understands all the plans discussed today and is in agreement with them.    Darden Flemister Felicia Horde, MD

## 2023-08-21 ENCOUNTER — Inpatient Hospital Stay: Payer: BC Managed Care – PPO

## 2023-10-08 ENCOUNTER — Other Ambulatory Visit: Payer: Self-pay | Admitting: Internal Medicine

## 2023-11-02 ENCOUNTER — Other Ambulatory Visit: Payer: Self-pay | Admitting: Internal Medicine

## 2024-01-05 ENCOUNTER — Other Ambulatory Visit: Payer: Self-pay | Admitting: Internal Medicine

## 2024-01-05 DIAGNOSIS — J019 Acute sinusitis, unspecified: Secondary | ICD-10-CM | POA: Diagnosis not present

## 2024-01-05 DIAGNOSIS — B9689 Other specified bacterial agents as the cause of diseases classified elsewhere: Secondary | ICD-10-CM | POA: Diagnosis not present

## 2024-01-05 DIAGNOSIS — R110 Nausea: Secondary | ICD-10-CM | POA: Diagnosis not present

## 2024-01-08 ENCOUNTER — Telehealth: Admitting: Internal Medicine

## 2024-01-08 VITALS — HR 88 | Temp 97.7°F | Resp 18

## 2024-01-08 DIAGNOSIS — U071 COVID-19: Secondary | ICD-10-CM

## 2024-01-08 DIAGNOSIS — R110 Nausea: Secondary | ICD-10-CM

## 2024-01-08 MED ORDER — PROMETHAZINE HCL 12.5 MG PO TABS: 12.5000 mg | ORAL_TABLET | Freq: Three times a day (TID) | ORAL | 0 refills | Status: AC | PRN

## 2024-01-08 MED ORDER — OMEPRAZOLE 40 MG PO CPDR: DELAYED_RELEASE_CAPSULE | ORAL | 5 refills | Status: AC

## 2024-01-08 NOTE — Assessment & Plan Note (Signed)
 I will send promethazine  12.5 mg 3 times a day for nausea.  She will eat and drink 30 minutes after taking this medicine.  I will also send her omeprazole  40 mg daily.  If she is not better then she will call.

## 2024-01-08 NOTE — Progress Notes (Signed)
   Acute Office Visit  Subjective:     Patient ID: Lori Freeman, female    DOB: 1961-04-04, 63 y.o.   MRN: [id number]  Chief Complaint  Patient presents with   Nausea    Nausea and Diarrhea for 11 days.    HPI Patient is in today for   Nausea since September 5. She says that she could not keep or eat anything because of the nausea.  She has body a lasted for 3 or 4 days.  No fever or chills no congestion.  She went to urgent care on Friday September 12 and her white cell count was elevated and she was started on amoxicillin.  She was also given Zofran but she says that that is not helping much.  She denies any constipation.  She says that after eating her stomach gets upset.  Her COVID test in our office is positive.  Review of Systems  Constitutional: Negative.   Respiratory: Negative.    Cardiovascular: Negative.   Gastrointestinal:  Positive for abdominal pain and nausea.        Objective:    Pulse 88   Temp 97.7 F (36.5 C)   Resp 18   SpO2 99%    Physical Exam Constitutional:      Appearance: Normal appearance.  Neurological:     General: No focal deficit present.     Mental Status: She is alert and oriented to person, place, and time.       This was a video visit so no physical exam was done.      Assessment & Plan:   Problem List Items Addressed This Visit       Other   Nausea     I will send promethazine  12.5 mg 3 times a day for nausea.  She will eat and drink 30 minutes after taking this medicine.  I will also send her omeprazole  40 mg daily.  If she is not better then she will call.      COVID-19 - Primary     Her COVID test is positive but her symptoms started 12 days ago.  Her body ache is also better.  I will not give any medication.  She will not go to work until Thursday.       No orders of the defined types were placed in this encounter.   No follow-ups on file.  Roetta Dare, MD

## 2024-01-08 NOTE — Assessment & Plan Note (Signed)
 Her COVID test is positive but her symptoms started 12 days ago.  Her body ache is also better.  I will not give any medication.  She will not go to work until Thursday.

## 2024-01-09 LAB — POC COVID19 BINAXNOW: SARS Coronavirus 2 Ag: POSITIVE — AB

## 2024-01-09 NOTE — Addendum Note (Signed)
 Addended byBETHA HILDEGARD ERNST on: 01/09/2024 01:17 PM   Modules accepted: Orders

## 2024-02-08 DIAGNOSIS — B0052 Herpesviral keratitis: Secondary | ICD-10-CM | POA: Diagnosis not present

## 2024-02-12 DIAGNOSIS — B0052 Herpesviral keratitis: Secondary | ICD-10-CM | POA: Diagnosis not present

## 2024-02-16 ENCOUNTER — Inpatient Hospital Stay: Admitting: Oncology

## 2024-02-18 NOTE — Progress Notes (Deleted)
 [facility]  [address] [place],  [place]  [address] [phone]  Clinic Day:  08/17/2023  Referring physician: Caleen Dirks, MD   HISTORY OF PRESENT ILLNESS:  The patient is a 63 y.o. female with hemochromatosis (C282Y/C282Y).  She occasionally requires maintenance phlebotomies to keep her disease from causing a precipitous rise in her iron parameters.  She comes in today for routine follow-up.  Since her last visit, the patient has been doing fairly well.  With respect to her hemochromatosis, she denies having any systemic symptoms which concern her for disease-related complications.   PHYSICAL EXAM:  There were no vitals taken for this visit. Wt Readings from Last 3 Encounters:  07/05/23 177 lb (80.3 kg)  04/24/23 177 lb 6 oz (80.5 kg)  12/23/22 179 lb 8 oz (81.4 kg)   There is no height or weight on file to calculate BMI. Performance status (ECOG): 0 - Asymptomatic Physical Exam Constitutional:      Appearance: Normal appearance. She is not ill-appearing.  HENT:     Mouth/Throat:     Mouth: Mucous membranes are moist.     Pharynx: Oropharynx is clear. No oropharyngeal exudate or posterior oropharyngeal erythema.  Cardiovascular:     Rate and Rhythm: Normal rate and regular rhythm.     Heart sounds: No murmur heard.    No friction rub. No gallop.  Pulmonary:     Effort: Pulmonary effort is normal. No respiratory distress.     Breath sounds: Normal breath sounds. No wheezing, rhonchi or rales.  Abdominal:     General: Bowel sounds are normal. There is no distension.     Palpations: Abdomen is soft. There is no mass.     Tenderness: There is no abdominal tenderness.  Musculoskeletal:        General: No swelling.     Right lower leg: No edema.     Left lower leg: No edema.  Lymphadenopathy:     Cervical: No cervical adenopathy.     Upper Body:     Right upper body: No supraclavicular or axillary adenopathy.     Left upper body: No  supraclavicular or axillary adenopathy.     Lower Body: No right inguinal adenopathy. No left inguinal adenopathy.  Skin:    General: Skin is warm.     Coloration: Skin is not jaundiced.     Findings: No lesion or rash.  Neurological:     General: No focal deficit present.     Mental Status: She is alert and oriented to person, place, and time. Mental status is at baseline.  Psychiatric:        Mood and Affect: Mood normal.        Behavior: Behavior normal.        Thought Content: Thought content normal.     LABS:      Latest Ref Rng & Units 08/17/2023   10:07 AM 02/16/2023   10:39 AM 10/13/2022    1:29 PM  CBC  WBC 4.0 - 10.5 K/uL 5.8  6.6  7.8   Hemoglobin 12.0 - 15.0 g/dL 86.6  85.8  86.6   Hematocrit 36.0 - 46.0 % 39.8  43.3  40.1   Platelets 150 - 400 K/uL 279  206  286       Latest Ref Rng & Units 08/17/2023   10:07 AM 02/16/2023   10:39 AM 10/13/2022    1:29 PM  CMP  Glucose 70 - 99 mg/dL 878  107  171   BUN 8 - 23 mg/dL 17  17  24    Creatinine 0.44 - 1.00 mg/dL 9.21  9.14  9.05   Sodium 135 - 145 mmol/L 140  139  138   Potassium 3.5 - 5.1 mmol/L 4.1  4.0  3.2   Chloride 98 - 111 mmol/L 104  105  104   CO2 22 - 32 mmol/L 25  22  24    Calcium  8.9 - 10.3 mg/dL 9.1  9.3  8.8   Total Protein 6.5 - 8.1 g/dL 6.8  7.6  6.9   Total Bilirubin 0.0 - 1.2 mg/dL 0.2  0.6  0.3   Alkaline Phos 38 - 126 U/L 96  89  72   AST 15 - 41 U/L 21  22  22    ALT 0 - 44 U/L 17  27  29      Latest Reference Range & Units 08/17/23 10:07  Iron 28 - 170 ug/dL 57  UIBC ug/dL 710  TIBC 749 - 549 ug/dL 653  Saturation Ratios 10.4 - 31.8 % 17  Ferritin 11 - 307 ng/mL 16   ASSESSMENT & PLAN:  Assessment/Plan:  A 63 y.o. female with hemochromatosis (C282Y/C282Y).  When evaluating her labs today, iron parameters remain ideal, particularly as her ferritin is below 50.  Based upon this, maintenance phlebotomies are not needed at this time.  I will see her back in 6 months to reassess her iron  parameters to determine if maintenance phlebotomies will be necessary then. The patient understands all the plans discussed today and is in agreement with them.    Yalanda Soderman DELENA Kerns, MD

## 2024-02-19 ENCOUNTER — Other Ambulatory Visit: Payer: Self-pay | Admitting: Oncology

## 2024-02-19 ENCOUNTER — Inpatient Hospital Stay: Admitting: Oncology

## 2024-02-19 ENCOUNTER — Other Ambulatory Visit: Payer: Self-pay | Admitting: Internal Medicine

## 2024-02-19 ENCOUNTER — Inpatient Hospital Stay: Attending: Oncology

## 2024-02-19 LAB — CMP (CANCER CENTER ONLY)
ALT: 28 U/L (ref 0–44)
AST: 26 U/L (ref 15–41)
Albumin: 4.5 g/dL (ref 3.5–5.0)
Alkaline Phosphatase: 106 U/L (ref 38–126)
Anion gap: 12 (ref 5–15)
BUN: 15 mg/dL (ref 8–23)
CO2: 25 mmol/L (ref 22–32)
Calcium: 9.5 mg/dL (ref 8.9–10.3)
Chloride: 101 mmol/L (ref 98–111)
Creatinine: 0.8 mg/dL (ref 0.44–1.00)
GFR, Estimated: >60 mL/min (ref >60)
Glucose, Bld: 92 mg/dL (ref 70–99)
Potassium: 4 mmol/L (ref 3.5–5.1)
Sodium: 138 mmol/L (ref 135–145)
Total Bilirubin: 0.4 mg/dL (ref 0.0–1.2)
Total Protein: 7.1 g/dL (ref 6.5–8.1)

## 2024-02-19 LAB — CBC WITH DIFFERENTIAL (CANCER CENTER ONLY)
Abs Immature Granulocytes: 0.05 K/uL (ref 0.00–0.07)
Basophils Absolute: 0.1 K/uL (ref 0.0–0.1)
Basophils Relative: 1 %
Eosinophils Absolute: 0.3 K/uL (ref 0.0–0.5)
Eosinophils Relative: 4 %
HCT: 41.9 % (ref 36.0–46.0)
Hemoglobin: 14.1 g/dL (ref 12.0–15.0)
Immature Granulocytes: 1 %
Lymphocytes Relative: 26 %
Lymphs Abs: 2 K/uL (ref 0.7–4.0)
MCH: 27.2 pg (ref 26.0–34.0)
MCHC: 33.7 g/dL (ref 30.0–36.0)
MCV: 80.7 fL (ref 80.0–100.0)
Monocytes Absolute: 0.7 K/uL (ref 0.1–1.0)
Monocytes Relative: 9 %
Neutro Abs: 4.5 K/uL (ref 1.7–7.7)
Neutrophils Relative %: 59 %
Platelet Count: 322 K/uL (ref 150–400)
RBC: 5.19 MIL/uL — ABNORMAL HIGH (ref 3.87–5.11)
RDW: 14.5 % (ref 11.5–15.5)
WBC Count: 7.5 K/uL (ref 4.0–10.5)
nRBC: 0 % (ref 0.0–0.2)

## 2024-02-19 LAB — IRON AND TIBC
Iron: 105 ug/dL (ref 28–170)
Saturation Ratios: 32 % — ABNORMAL HIGH (ref 10.4–31.8)
TIBC: 330 ug/dL (ref 250–450)
UIBC: 225 ug/dL

## 2024-02-19 LAB — FERRITIN: Ferritin: 77 ng/mL (ref 11–307)

## 2024-02-19 NOTE — Progress Notes (Unsigned)
 [facility]  [address] [place],  [place]  [address] [phone]  Clinic Day:  08/17/2023  Referring physician: Caleen Dirks, MD   HISTORY OF PRESENT ILLNESS:  The patient is a 63 y.o. female with hemochromatosis (C282Y/C282Y).  She occasionally requires maintenance phlebotomies to keep her disease from causing a precipitous rise in her iron parameters.  She comes in today for routine follow-up.  Since her last visit, the patient has been doing fairly well.  With respect to her hemochromatosis, she denies having any systemic symptoms which concern her for disease-related complications.   PHYSICAL EXAM:  There were no vitals taken for this visit. Wt Readings from Last 3 Encounters:  07/05/23 177 lb (80.3 kg)  04/24/23 177 lb 6 oz (80.5 kg)  12/23/22 179 lb 8 oz (81.4 kg)   There is no height or weight on file to calculate BMI. Performance status (ECOG): 0 - Asymptomatic Physical Exam Constitutional:      Appearance: Normal appearance. She is not ill-appearing.  HENT:     Mouth/Throat:     Mouth: Mucous membranes are moist.     Pharynx: Oropharynx is clear. No oropharyngeal exudate or posterior oropharyngeal erythema.  Cardiovascular:     Rate and Rhythm: Normal rate and regular rhythm.     Heart sounds: No murmur heard.    No friction rub. No gallop.  Pulmonary:     Effort: Pulmonary effort is normal. No respiratory distress.     Breath sounds: Normal breath sounds. No wheezing, rhonchi or rales.  Abdominal:     General: Bowel sounds are normal. There is no distension.     Palpations: Abdomen is soft. There is no mass.     Tenderness: There is no abdominal tenderness.  Musculoskeletal:        General: No swelling.     Right lower leg: No edema.     Left lower leg: No edema.  Lymphadenopathy:     Cervical: No cervical adenopathy.     Upper Body:     Right upper body: No supraclavicular or axillary adenopathy.     Left upper body: No  supraclavicular or axillary adenopathy.     Lower Body: No right inguinal adenopathy. No left inguinal adenopathy.  Skin:    General: Skin is warm.     Coloration: Skin is not jaundiced.     Findings: No lesion or rash.  Neurological:     General: No focal deficit present.     Mental Status: She is alert and oriented to person, place, and time. Mental status is at baseline.  Psychiatric:        Mood and Affect: Mood normal.        Behavior: Behavior normal.        Thought Content: Thought content normal.     LABS:      Latest Ref Rng & Units 02/19/2024    2:46 PM 08/17/2023   10:07 AM 02/16/2023   10:39 AM  CBC  WBC 4.0 - 10.5 K/uL 7.5  5.8  6.6   Hemoglobin 12.0 - 15.0 g/dL 85.8  86.6  85.8   Hematocrit 36.0 - 46.0 % 41.9  39.8  43.3   Platelets 150 - 400 K/uL 322  279  206       Latest Ref Rng & Units 02/19/2024    2:46 PM 08/17/2023   10:07 AM 02/16/2023   10:39 AM  CMP  Glucose 70 - 99 mg/dL 92  121  107   BUN 8 - 23 mg/dL 15  17  17    Creatinine 0.44 - 1.00 mg/dL 9.19  9.21  9.14   Sodium 135 - 145 mmol/L 138  140  139   Potassium 3.5 - 5.1 mmol/L 4.0  4.1  4.0   Chloride 98 - 111 mmol/L 101  104  105   CO2 22 - 32 mmol/L 25  25  22    Calcium  8.9 - 10.3 mg/dL 9.5  9.1  9.3   Total Protein 6.5 - 8.1 g/dL 7.1  6.8  7.6   Total Bilirubin 0.0 - 1.2 mg/dL 0.4  0.2  0.6   Alkaline Phos 38 - 126 U/L 106  96  89   AST 15 - 41 U/L 26  21  22    ALT 0 - 44 U/L 28  17  27      Latest Reference Range & Units 02/19/24 14:46  Iron 28 - 170 ug/dL 894  UIBC ug/dL 774  TIBC 749 - 549 ug/dL 669  Saturation Ratios 10.4 - 31.8 % 32 (H)  Ferritin 11 - 307 ng/mL 77  (H): Data is abnormally high ASSESSMENT & PLAN:  Assessment/Plan:  A 63 y.o. female with hemochromatosis (C282Y/C282Y).  When evaluating her labs today, iron parameters remain ideal, particularly as her ferritin is below 50.  Based upon this, maintenance phlebotomies are not needed at this time.  I will see her back in  6 months to reassess her iron parameters to determine if maintenance phlebotomies will be necessary then. The patient understands all the plans discussed today and is in agreement with them.    Kennia Vanvorst DELENA Kerns, MD

## 2024-02-20 ENCOUNTER — Telehealth: Payer: Self-pay | Admitting: Oncology

## 2024-02-20 ENCOUNTER — Telehealth: Payer: Self-pay

## 2024-02-20 ENCOUNTER — Other Ambulatory Visit: Payer: Self-pay | Admitting: Oncology

## 2024-02-20 ENCOUNTER — Inpatient Hospital Stay (HOSPITAL_BASED_OUTPATIENT_CLINIC_OR_DEPARTMENT_OTHER): Admitting: Oncology

## 2024-02-20 NOTE — Telephone Encounter (Signed)
 Patient has been scheduled for follow-up visit per 02/20/24 LOS.  Pt aware of scheduled appt details.

## 2024-02-20 NOTE — Telephone Encounter (Signed)
 Latest Reference Range & Units 02/19/24 14:46  Iron 28 - 170 ug/dL 894  UIBC ug/dL 774  TIBC 749 - 549 ug/dL 669  Saturation Ratios 10.4 - 31.8 % 32 (H)  Ferritin 11 - 307 ng/mL 77  (H): Data is abnormally high  Dr Ezzard states he talked with pt regarding results.

## 2024-02-23 ENCOUNTER — Inpatient Hospital Stay

## 2024-02-23 NOTE — Progress Notes (Signed)
 Montie Dux Geralene Brooks presents today for phlebotomy per MD orders. Phlebotomy procedure started at 0840 and ended at 0855. 500 ml removed. Patient observed for 30 minutes after procedure without any incident. Patient tolerated procedure well. IV needle removed intact. #18 RIGHT AC

## 2024-04-04 ENCOUNTER — Other Ambulatory Visit: Payer: Self-pay | Admitting: Internal Medicine

## 2024-04-24 ENCOUNTER — Inpatient Hospital Stay: Attending: Oncology

## 2024-04-24 NOTE — Progress Notes (Signed)
 Montie Dux Lori Freeman presents today for phlebotomy per MD orders. Phlebotomy procedure started at 0845 and ended at 0906. 500 mL removed. Patient observed for 30 minutes after procedure without any incident. Patient tolerated procedure well. IV needle removed intact. #18 RIGHT AC

## 2024-04-24 NOTE — Patient Instructions (Signed)

## 2024-05-01 ENCOUNTER — Ambulatory Visit: Admitting: Internal Medicine

## 2024-05-01 ENCOUNTER — Encounter: Payer: Self-pay | Admitting: Internal Medicine

## 2024-05-01 VITALS — BP 130/70 | HR 75 | Temp 97.5°F | Resp 18 | Ht 65.0 in | Wt 182.2 lb

## 2024-05-01 DIAGNOSIS — J40 Bronchitis, not specified as acute or chronic: Secondary | ICD-10-CM | POA: Diagnosis not present

## 2024-05-01 DIAGNOSIS — J0100 Acute maxillary sinusitis, unspecified: Secondary | ICD-10-CM

## 2024-05-01 LAB — POCT XPERT XPRESS SARS COVID-2/FLU/RSV
FLU A: NEGATIVE
FLU B: NEGATIVE
RSV RNA, PCR: NEGATIVE
SARS Coronavirus 2: NEGATIVE

## 2024-05-01 MED ORDER — AZITHROMYCIN 250 MG PO TABS: ORAL_TABLET | ORAL | 0 refills | Status: AC

## 2024-05-01 MED ORDER — FLUTICASONE PROPIONATE 50 MCG/ACT NA SUSP: 1.0000 | Freq: Every day | NASAL | 2 refills | Status: AC

## 2024-05-01 NOTE — Addendum Note (Signed)
 Addended byBETHA HILDEGARD ERNST on: 05/01/2024 01:12 PM   Modules accepted: Orders

## 2024-05-01 NOTE — Progress Notes (Signed)
" ° °  Acute Office Visit  Subjective:     Patient ID: Lori Freeman, female    DOB: 1961-03-02, 64 y.o.   MRN: [id number]  Chief Complaint  Patient presents with   Cough    Headache, diarrhea , body chills    HPI Patient is in today for   ROS      Objective:    BP 130/70   Pulse 75   Temp (!) 97.5 F (36.4 C)   Resp 18   Ht 5' 5 (1.651 m)   Wt 182 lb 4 oz (82.7 kg)   SpO2 97%   BMI 30.33 kg/m    Physical Exam  No results found for any visits on 05/01/24.      Assessment & Plan:   Problem List Items Addressed This Visit   None   No orders of the defined types were placed in this encounter.   No follow-ups on file.  Roetta Dare, MD   "

## 2024-05-01 NOTE — Addendum Note (Signed)
 Addended by: Emitt Maglione on: 05/01/2024 12:32 PM   Modules accepted: Orders

## 2024-05-19 ENCOUNTER — Other Ambulatory Visit: Payer: Self-pay | Admitting: Internal Medicine

## 2024-06-21 ENCOUNTER — Inpatient Hospital Stay

## 2024-06-24 ENCOUNTER — Inpatient Hospital Stay

## 2024-06-24 ENCOUNTER — Inpatient Hospital Stay: Admitting: Oncology
